# Patient Record
Sex: Female | Born: 2000 | Hispanic: Yes | Marital: Married | Smoking: Never smoker
Health system: Southern US, Community
[De-identification: ages and names within clinical notes are randomized; demographics above are authoritative.]

## PROBLEM LIST (undated history)

## (undated) ENCOUNTER — Inpatient Hospital Stay (HOSPITAL_COMMUNITY): Payer: Self-pay

## (undated) DIAGNOSIS — K831 Obstruction of bile duct: Secondary | ICD-10-CM

## (undated) DIAGNOSIS — T7840XA Allergy, unspecified, initial encounter: Secondary | ICD-10-CM

## (undated) DIAGNOSIS — G43909 Migraine, unspecified, not intractable, without status migrainosus: Secondary | ICD-10-CM

## (undated) DIAGNOSIS — J45909 Unspecified asthma, uncomplicated: Secondary | ICD-10-CM

## (undated) DIAGNOSIS — K8020 Calculus of gallbladder without cholecystitis without obstruction: Secondary | ICD-10-CM

## (undated) HISTORY — DX: Migraine, unspecified, not intractable, without status migrainosus: G43.909

## (undated) HISTORY — DX: Allergy, unspecified, initial encounter: T78.40XA

## (undated) HISTORY — PX: OTHER SURGICAL HISTORY: SHX169

## (undated) HISTORY — DX: Calculus of gallbladder without cholecystitis without obstruction: K80.20

---
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
## 2019-08-03 ENCOUNTER — Encounter (HOSPITAL_COMMUNITY): Payer: Self-pay | Admitting: Emergency Medicine

## 2019-08-03 ENCOUNTER — Other Ambulatory Visit: Payer: Self-pay

## 2019-08-03 ENCOUNTER — Emergency Department (HOSPITAL_COMMUNITY)
Admission: EM | Admit: 2019-08-03 | Discharge: 2019-08-03 | Disposition: A | Discharge status: Home / Self Care | Payer: Medicaid Other | Attending: Emergency Medicine | Admitting: Emergency Medicine

## 2019-08-03 ENCOUNTER — Emergency Department (HOSPITAL_COMMUNITY): Payer: Medicaid Other

## 2019-08-03 DIAGNOSIS — J45901 Unspecified asthma with (acute) exacerbation: Secondary | ICD-10-CM | POA: Diagnosis not present

## 2019-08-03 DIAGNOSIS — R0602 Shortness of breath: Secondary | ICD-10-CM | POA: Diagnosis present

## 2019-08-03 LAB — I-STAT BETA HCG BLOOD, ED (MC, WL, AP ONLY): I-stat hCG, quantitative: <5 m[IU]/mL (ref <5)

## 2019-08-03 MED ORDER — PREDNISONE 20 MG PO TABS
60.0000 mg | ORAL_TABLET | Freq: Once | ORAL | Status: AC
  Administered 2019-08-03: 60 mg via ORAL
  Filled 2019-08-03: qty 3

## 2019-08-03 MED ORDER — ALBUTEROL SULFATE (2.5 MG/3ML) 0.083% IN NEBU
2.5000 mg | INHALATION_SOLUTION | Freq: Three times a day (TID) | RESPIRATORY_TRACT | Status: DC | PRN
  Administered 2019-08-03 (×2): 2.5 mg via RESPIRATORY_TRACT
  Filled 2019-08-03: qty 3

## 2019-08-03 MED ORDER — METHYLPREDNISOLONE 4 MG PO TBPK: ORAL_TABLET | ORAL | 0 refills | Status: DC

## 2019-08-03 MED ORDER — ALBUTEROL SULFATE HFA 108 (90 BASE) MCG/ACT IN AERS: 2.0000 | INHALATION_SPRAY | RESPIRATORY_TRACT | 3 refills | Status: DC | PRN

## 2019-08-03 NOTE — ED Notes (Signed)
Pt discharge home per MD order. Discharge summary reviewed with pt, pt verbalizes understanding. Ambulatory off unit with visitor. No s/s of acute distress noted.

## 2019-08-03 NOTE — ED Provider Notes (Signed)
[facility] EM[facility] DEPART[facility] Provider Note   CSN: [id number] Arrival date & time: 08/03/19  7829     History   Chief Complaint Chief Complaint  Patient presents with  . Asthma  . Shortness of Breath    HPI Charlotte Cohen is a 18 y.o. female with history of asthma, presenting to the emergency department with cough and shortness of breath.  She reports her symptoms began approximately 3 days ago.  She believes the weather change was a trigger for her asthma.  She says she feels her chest is tight and she is wheezing, and says "I think may have bronchitis".  She has been using her albuterol pump every 3 hours at home with a spacer.  She does not have a nebulizer machine.  She states she is not achieving relief.  Her husband at the bedside states she does like to sleep with the air conditioning on, thinks this may be exacerbating her symptoms.  She denies fevers, chills, productive cough, sore throat, headache, myalgia.  She states she been staying at home and self isolating during the virus season.  She lives with her husband.  She has no sick contacts in the house.  She does not smoke or use any recreational drugs.  She just moved to the region a few months ago, and does not have a primary care physician here.  She denies any history of intubation for her asthma or needing to the ICU.  She states she does not remember the last time she was on steroids, believes it has been at least over 1 year.  NKDA No other reported medical problems     HPI  History reviewed. No pertinent past medical history.  There are no active problems to display for this patient.   History reviewed. No pertinent surgical history.   OB History   No obstetric history on file.      Home Medications    Prior to Admission medications   Medication Sig Start Date End Date Taking? Authorizing Provider  albuterol (VENTOLIN HFA) 108 (90 Base) MCG/ACT inhaler Inhale 2 puffs  into the lungs every 4 (four) hours as needed for wheezing or shortness of breath. 08/03/19   Wyvonnia Dusky, MD  methylPREDNISolone (MEDROL DOSEPAK) 4 MG TBPK tablet Use as instructed on package 08/03/19   Wyvonnia Dusky, MD    Family History No family history on file.  Social History Social History   Tobacco Use  . Smoking status: Never Smoker  . Smokeless tobacco: Never Used  Substance Use Topics  . Alcohol use: Never    Frequency: Never  . Drug use: Never     Allergies   Patient has no allergy information on record.   Review of Systems Review of Systems  Constitutional: Negative for chills and fever.  HENT: Negative for congestion and sore throat.   Eyes: Negative for pain and visual disturbance.  Respiratory: Positive for cough, chest tightness and shortness of breath.   Cardiovascular: Negative for chest pain and palpitations.  Gastrointestinal: Negative for abdominal pain, nausea and vomiting.  Musculoskeletal: Negative for arthralgias and myalgias.  Skin: Negative for pallor and rash.  Neurological: Negative for syncope and light-headedness.  Psychiatric/Behavioral: Negative for agitation and confusion.  All other systems reviewed and are negative.    Physical Exam Updated Vital Signs BP 123/82   Pulse 96   Temp 98.6 F (37 C) (Oral)   Resp 18   Wt 98 kg   LMP  07/28/2019   SpO2 97%   Physical Exam Vitals signs and nursing note reviewed.  Constitutional:      General: She is not in acute distress.    Appearance: She is well-developed. She is obese.  HENT:     Head: Normocephalic and atraumatic.  Eyes:     Conjunctiva/sclera: Conjunctivae normal.  Neck:     Musculoskeletal: Neck supple.  Cardiovascular:     Rate and Rhythm: Normal rate and regular rhythm.     Pulses: Normal pulses.     Comments: HR 98 Pulmonary:     Effort: Pulmonary effort is normal. No accessory muscle usage or respiratory distress.     Comments: 95% on room air  Speaking comfortably in full sentences Diffuse bilateral expiratory wheezing in all lung fields Abdominal:     Palpations: Abdomen is soft.     Tenderness: There is no abdominal tenderness.  Skin:    General: Skin is warm and dry.  Neurological:     General: No focal deficit present.     Mental Status: She is alert and oriented to person, place, and time.      ED Treatments / Results  Labs (all labs ordered are listed, but only abnormal results are displayed) Labs Reviewed  I-STAT BETA HCG BLOOD, ED ([facility], [facility], [facility] ONLY)    EKG None  Radiology Dg Chest 2 View  Result Date: 08/03/2019 CLINICAL DATA:  Shortness of breath for 3 days. History of asthma. EXAM: CHEST - 2 VIEW COMPARISON:  None. FINDINGS: The cardiomediastinal contours are normal. Mild central bronchial thickening. Pulmonary vasculature is normal. No consolidation, pleural effusion, or pneumothorax. No acute osseous abnormalities are seen. IMPRESSION: Mild central bronchial thickening, can be seen with asthma or bronchitis. Electronically Signed   By: Narda Rutherford M.D.   On: 08/03/2019 06:59    Procedures Procedures (including critical care time)  Medications Ordered in ED Medications  predniSONE (DELTASONE) tablet 60 mg (60 mg Oral Given 08/03/19 4827)     Initial Impression / Assessment and Plan / ED Course  I have reviewed the triage vital signs and the nursing notes.  Pertinent labs & imaging results that were available during my care of the patient were reviewed by me and considered in my medical decision making (see chart for details).  This is a 18 year old female presenting to emergency department asthma exacerbation ongoing for 3 days.  She has diffuse wheezing on exam.  She is satting 95% on room air, but otherwise demonstrates no evidence of acute respiratory distress or failure.  We will give her 3 rounds of albuterol nebulizers as well as start her on prednisone.  She has no fever or  consolidations in her x-rays to suggest pneumonia.  Do not believe she needs antibiotics at this time.  I have a low suspicion for COVID-19 at this time based on her clinical presentation    Final Clinical Impressions(s) / ED Diagnoses   Final diagnoses:  Exacerbation of asthma, unspecified asthma severity, unspecified whether persistent    ED Discharge Orders         Ordered    methylPREDNISolone (MEDROL DOSEPAK) 4 MG TBPK tablet     08/03/19 0949    albuterol (VENTOLIN HFA) 108 (90 Base) MCG/ACT inhaler  Every 4 hours PRN     08/03/19 0949           Terald Sleeper, MD 08/03/19 [phone]

## 2019-08-03 NOTE — ED Triage Notes (Addendum)
Pt in with sob x 3 days. States hx of asthma and inhaler not helping. States chest sore from coughing, denies fever/chills or any sick contacts. Expiratory wheezes and cough noted

## 2019-08-03 NOTE — Discharge Instructions (Signed)
You should call the number listed in your discharge instructions to establish care with a new primary care doctor.  It is very important that you have a primary care doctor they can follow along and manage your asthma.

## 2019-09-16 HISTORY — PX: WISDOM TOOTH EXTRACTION: SHX21

## 2020-04-09 DIAGNOSIS — E669 Obesity, unspecified: Secondary | ICD-10-CM | POA: Diagnosis not present

## 2020-04-09 DIAGNOSIS — Z0000 Encounter for general adult medical examination without abnormal findings: Secondary | ICD-10-CM | POA: Diagnosis not present

## 2020-04-09 DIAGNOSIS — Z113 Encounter for screening for infections with a predominantly sexual mode of transmission: Secondary | ICD-10-CM | POA: Diagnosis not present

## 2020-04-09 DIAGNOSIS — Z1389 Encounter for screening for other disorder: Secondary | ICD-10-CM | POA: Diagnosis not present

## 2020-04-10 DIAGNOSIS — J45909 Unspecified asthma, uncomplicated: Secondary | ICD-10-CM | POA: Insufficient documentation

## 2020-09-15 HISTORY — PX: BARIATRIC SURGERY: SHX1103

## 2021-02-07 DIAGNOSIS — R7303 Prediabetes: Secondary | ICD-10-CM | POA: Diagnosis not present

## 2021-02-07 DIAGNOSIS — J45909 Unspecified asthma, uncomplicated: Secondary | ICD-10-CM | POA: Diagnosis not present

## 2021-03-23 DIAGNOSIS — J4541 Moderate persistent asthma with (acute) exacerbation: Secondary | ICD-10-CM | POA: Diagnosis not present

## 2021-05-14 ENCOUNTER — Other Ambulatory Visit: Payer: Self-pay

## 2021-05-14 ENCOUNTER — Ambulatory Visit: Payer: Self-pay | Admitting: Family Medicine

## 2021-05-15 ENCOUNTER — Other Ambulatory Visit: Payer: Self-pay

## 2021-05-15 ENCOUNTER — Encounter: Payer: Self-pay | Admitting: Family Medicine

## 2021-05-15 ENCOUNTER — Ambulatory Visit (INDEPENDENT_AMBULATORY_CARE_PROVIDER_SITE_OTHER): Payer: Medicaid Other | Admitting: Family Medicine

## 2021-05-15 VITALS — BP 116/83 | HR 114 | Temp 97.3°F | Resp 20 | Ht 63.0 in | Wt 227.0 lb

## 2021-05-15 DIAGNOSIS — J309 Allergic rhinitis, unspecified: Secondary | ICD-10-CM | POA: Diagnosis not present

## 2021-05-15 DIAGNOSIS — Z0289 Encounter for other administrative examinations: Secondary | ICD-10-CM | POA: Diagnosis not present

## 2021-05-15 DIAGNOSIS — J019 Acute sinusitis, unspecified: Secondary | ICD-10-CM | POA: Diagnosis not present

## 2021-05-15 DIAGNOSIS — Z7689 Persons encountering health services in other specified circumstances: Secondary | ICD-10-CM

## 2021-05-15 DIAGNOSIS — Z6841 Body Mass Index (BMI) 40.0 and over, adult: Secondary | ICD-10-CM

## 2021-05-15 MED ORDER — CETIRIZINE HCL 10 MG PO TABS: 10.0000 mg | ORAL_TABLET | Freq: Every day | ORAL | 1 refills | Status: DC

## 2021-05-15 MED ORDER — FLUTICASONE PROPIONATE 50 MCG/ACT NA SUSP: 2.0000 | Freq: Every day | NASAL | 2 refills | Status: AC

## 2021-05-15 MED ORDER — AZITHROMYCIN 250 MG PO TABS: ORAL_TABLET | ORAL | 0 refills | Status: AC

## 2021-05-15 NOTE — Progress Notes (Signed)
New Patient Office Visit  Subjective:  Patient ID: Charlotte Cohen, female    DOB: 01/05/2001  Age: 20 y.o. MRN: [id number]  CC: No chief complaint on file.   HPI Charlotte Cohen presents for to establish care and for completion of forms for bariatric surgery. Patient also reports recurrent sinus congestion and pain with yellowish drainage. She denies fever/chills or viral sx.   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    Family History  Problem Relation Age of Onset   Cancer Maternal Grandmother     Social History   Socioeconomic History   Marital status: Married    Spouse name: Not on file   Number of children: 0   Years of education: Not on file   Highest education level: Not on file  Occupational History   Not on file  Tobacco Use   Smoking status: Never   Smokeless tobacco: Never  Vaping Use   Vaping Use: Never used  Substance and Sexual Activity   Alcohol use: Never   Drug use: Never   Sexual activity: Yes  Other Topics Concern   Not on file  Social History Narrative   Not on file   Social Determinants of Health   Financial Resource Strain: Not on file  Food Insecurity: Not on file  Transportation Needs: Not on file  Physical Activity: Not on file  Stress: Not on file  Social Connections: Not on file  Intimate Partner Violence: Not on file    ROS Review of Systems  Constitutional:  Negative for fever.  HENT:  Positive for congestion, sinus pressure and sinus pain.   All other systems reviewed and are negative.  Objective:   Today's Vitals: BP 116/83   Pulse (!) 114   Temp (!) 97.3 F (36.3 C)   Resp 20   Ht 5\' 3"  (1.6 m)   Wt 227 lb (103 kg)   LMP  (LMP Unknown) Comment: states LMP was 5 mos ago and irregular  SpO2 92%   BMI 40.21 kg/m   Physical Exam Vitals and nursing note reviewed.  Constitutional:      General: She  is not in acute distress.    Appearance: She is obese.  HENT:     Right Ear: Tympanic membrane normal.     Left Ear: Tympanic membrane normal.     Nose: Congestion present.     Right Sinus: Maxillary sinus tenderness present.     Left Sinus: Maxillary sinus tenderness present.  Cardiovascular:     Rate and Rhythm: Normal rate and regular rhythm.  Pulmonary:     Effort: Pulmonary effort is normal.     Breath sounds: Normal breath sounds.  Abdominal:     Palpations: Abdomen is soft.     Tenderness: There is no abdominal tenderness.  Neurological:     General: No focal deficit present.     Mental Status: She is alert and oriented to person, place, and time.    Assessment & Plan:  1. Class 3 severe obesity due to excess calories without serious comorbidity with body mass index (BMI) of 40.0 to 44.9 in adult [facility]) Management as per consultant. Patient to be scheduled for bariatric surgery.  2. Encounter for completion of form with patient Form completed for bariatric surgery scheduling  3. Allergic rhinitis, unspecified seasonality, unspecified trigger Flonase and  zyrtec prescribed.  4. Acute sinusitis, recurrence not specified, unspecified location As above. Zithromax prescribed. Tylenol/nsaids prn  5. Encounter to establish care   Outpatient Encounter Medications as of 05/15/2021  Medication Sig   [DISCONTINUED] albuterol (VENTOLIN HFA) 108 (90 Base) MCG/ACT inhaler Inhale 2 puffs into the lungs every 4 (four) hours as needed for wheezing or shortness of breath. (Patient not taking: Reported on 05/15/2021)   [DISCONTINUED] methylPREDNISolone (MEDROL DOSEPAK) 4 MG TBPK tablet Use as instructed on package (Patient not taking: Reported on 05/15/2021)   No facility-administered encounter medications on file as of 05/15/2021.    Follow-up: Return if symptoms worsen or fail to improve.   Tommie Raymond, MD

## 2021-05-15 NOTE — Progress Notes (Signed)
New patient/ establish care Discuss concerns about surgery

## 2021-05-17 ENCOUNTER — Ambulatory Visit (INDEPENDENT_AMBULATORY_CARE_PROVIDER_SITE_OTHER): Payer: Medicaid Other

## 2021-05-17 ENCOUNTER — Other Ambulatory Visit: Payer: Self-pay

## 2021-05-17 ENCOUNTER — Ambulatory Visit
Admission: EM | Admit: 2021-05-17 | Discharge: 2021-05-17 | Disposition: A | Discharge status: Home / Self Care | Payer: Medicaid Other | Attending: Urgent Care | Admitting: Urgent Care

## 2021-05-17 DIAGNOSIS — R059 Cough, unspecified: Secondary | ICD-10-CM | POA: Diagnosis not present

## 2021-05-17 DIAGNOSIS — R0602 Shortness of breath: Secondary | ICD-10-CM | POA: Diagnosis not present

## 2021-05-17 DIAGNOSIS — J4540 Moderate persistent asthma, uncomplicated: Secondary | ICD-10-CM | POA: Diagnosis not present

## 2021-05-17 DIAGNOSIS — R062 Wheezing: Secondary | ICD-10-CM

## 2021-05-17 HISTORY — DX: Unspecified asthma, uncomplicated: J45.909

## 2021-05-17 MED ORDER — PREDNISONE 20 MG PO TABS: ORAL_TABLET | ORAL | 0 refills | Status: DC

## 2021-05-17 MED ORDER — METHYLPREDNISOLONE ACETATE 80 MG/ML IJ SUSP
80.0000 mg | Freq: Once | INTRAMUSCULAR | Status: AC
  Administered 2021-05-17: 80 mg via INTRAMUSCULAR

## 2021-05-17 NOTE — ED Triage Notes (Signed)
Pt c/o SOB onset yesterday worst today. States has asthma and uses nebulizer at home. Last treatment was yesterday approx 11pm with temporary relief.   Sneezing started last weekend  Coughing started last Wednesday   Today noticed blood in sputum with worsening SOB.

## 2021-05-17 NOTE — ED Provider Notes (Signed)
[facility]   MRN: [id number] DOB: [birth year]  Subjective:   Charlotte Cohen is a 20 y.o. female presenting for 4 day history of acute onset wheezing, shob, cough, nasal congestion. Noticed green mucus and blood in her cough today. Has a history of asthma, uses nebulized albuterol, did not today.  Does not need refills on her nebulized albuterol.  States that she is also been having a very difficult time with her sinuses.  Takes Flonase and Zyrtec daily.  She did contact her PCP who prescribed her azithromycin.  No testing was done.  Does not want a COVID test today.  No current facility-administered medications for this encounter.  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    Appearance: Normal appearance. She is well-developed. She is not ill-appearing, toxic-appearing or diaphoretic.  HENT:     Head:  Normocephalic and atraumatic.     Nose: Nose normal.     Mouth/Throat:     Mouth: Mucous membranes are moist.     Pharynx: Oropharynx is clear.  Eyes:     General: No scleral icterus.    Extraocular Movements: Extraocular movements intact.     Pupils: Pupils are equal, round, and reactive to light.  Cardiovascular:     Rate and Rhythm: Normal rate and regular rhythm.     Pulses: Normal pulses.     Heart sounds: Normal heart sounds. No murmur heard.   No friction rub. No gallop.  Pulmonary:     Effort: Pulmonary effort is normal. No respiratory distress.     Breath sounds: No stridor. Examination of the right-upper field reveals wheezing. Examination of the left-upper field reveals wheezing. Examination of the right-middle field reveals wheezing. Examination of the left-middle field reveals wheezing. Examination of the right-lower field reveals wheezing. Examination of the left-lower field reveals wheezing. Wheezing present. No decreased breath sounds, rhonchi or rales.  Skin:    General: Skin is warm and dry.     Findings: No rash.  Neurological:     General: No focal deficit present.     Mental Status: She is alert and oriented to person, place,  and time.  Psychiatric:        Mood and Affect: Mood normal.        Behavior: Behavior normal.        Thought Content: Thought content normal.     DG Chest 2 View  Result Date: 05/17/2021 CLINICAL DATA:  Short of breath, cough EXAM: CHEST - 2 VIEW COMPARISON:  08/03/2019 FINDINGS: The heart size and mediastinal contours are within normal limits. Both lungs are clear. The visualized skeletal structures are unremarkable. IMPRESSION: No active cardiopulmonary disease. Electronically Signed   By: Marlan Palau M.D.   On: 05/17/2021 13:42     Assessment and Plan :   PDMP not reviewed this encounter.  1. Moderate persistent asthma without complication   2. Wheezing     Patient refused COVID-19 testing.  Recommended IM Depo-Medrol in  clinic, oral prednisone starting tomorrow.  Emphasized need to maintain nebulized albuterol treatments.  Use supportive care otherwise. Counseled patient on potential for adverse effects with medications prescribed/recommended today, ER and return-to-clinic precautions discussed, patient verbalized understanding.    Wallis Bamberg, [place] 05/17/21 1540

## 2021-12-10 DIAGNOSIS — N926 Irregular menstruation, unspecified: Secondary | ICD-10-CM | POA: Diagnosis not present

## 2021-12-10 DIAGNOSIS — N62 Hypertrophy of breast: Secondary | ICD-10-CM | POA: Diagnosis not present

## 2022-01-16 ENCOUNTER — Telehealth: Payer: Self-pay | Admitting: Family Medicine

## 2022-01-16 ENCOUNTER — Other Ambulatory Visit: Payer: Self-pay

## 2022-01-16 DIAGNOSIS — J309 Allergic rhinitis, unspecified: Secondary | ICD-10-CM

## 2022-01-16 MED ORDER — CETIRIZINE HCL 10 MG PO TABS: 10.0000 mg | ORAL_TABLET | Freq: Every day | ORAL | 1 refills | Status: DC

## 2022-01-16 NOTE — Telephone Encounter (Signed)
Medication Refill - Medication:  ?montelukast ? ?Has the patient contacted their pharmacy? Yes.   ?Contact PCP- ?*I was trying to locate the medication, but did not see it, pt was 100% sure that she takes it for her allergies and it should be there* ? ?Preferred Pharmacy (with phone number or street name):  ?[id number] [place], [place] - [address]  ?[address], [place] [address]  ?Phone:  [phone]  Fax:  [phone]  ? ?Has the patient been seen for an appointment in the last year OR does the patient have an upcoming appointment? Yes.   ? ?Agent: Please be advised that RX refills may take up to 3 business days. We ask that you follow-up with your pharmacy. ?

## 2022-01-16 NOTE — Telephone Encounter (Signed)
Cetirizine refilled for pt. ?

## 2022-01-30 DIAGNOSIS — R102 Pelvic and perineal pain: Secondary | ICD-10-CM | POA: Diagnosis not present

## 2022-01-30 DIAGNOSIS — N926 Irregular menstruation, unspecified: Secondary | ICD-10-CM | POA: Diagnosis not present

## 2022-02-04 ENCOUNTER — Ambulatory Visit
Admission: EM | Admit: 2022-02-04 | Discharge: 2022-02-04 | Disposition: A | Discharge status: Home / Self Care | Payer: Medicaid Other | Attending: Urgent Care | Admitting: Urgent Care

## 2022-02-04 DIAGNOSIS — H9202 Otalgia, left ear: Secondary | ICD-10-CM | POA: Diagnosis not present

## 2022-02-04 DIAGNOSIS — R0982 Postnasal drip: Secondary | ICD-10-CM | POA: Diagnosis not present

## 2022-02-04 DIAGNOSIS — R070 Pain in throat: Secondary | ICD-10-CM | POA: Diagnosis not present

## 2022-02-04 DIAGNOSIS — J309 Allergic rhinitis, unspecified: Secondary | ICD-10-CM | POA: Insufficient documentation

## 2022-02-04 DIAGNOSIS — S39012A Strain of muscle, fascia and tendon of lower back, initial encounter: Secondary | ICD-10-CM | POA: Diagnosis not present

## 2022-02-04 LAB — POCT RAPID STREP A (OFFICE): Rapid Strep A Screen: NEGATIVE

## 2022-02-04 MED ORDER — ESOMEPRAZOLE MAGNESIUM 40 MG PO CPDR: 40.0000 mg | DELAYED_RELEASE_CAPSULE | Freq: Every day | ORAL | 0 refills | Status: DC

## 2022-02-04 MED ORDER — MONTELUKAST SODIUM 10 MG PO TABS: 10.0000 mg | ORAL_TABLET | Freq: Every day | ORAL | 0 refills | Status: DC

## 2022-02-04 MED ORDER — PREDNISONE 10 MG PO TABS: 30.0000 mg | ORAL_TABLET | Freq: Every day | ORAL | 0 refills | Status: DC

## 2022-02-04 MED ORDER — TIZANIDINE HCL 4 MG PO TABS: 4.0000 mg | ORAL_TABLET | Freq: Every day | ORAL | 0 refills | Status: DC

## 2022-02-04 NOTE — ED Provider Notes (Signed)
[facility]   MRN: [id number] DOB: [birth year]  Subjective:   Charlotte Cohen is a 21 y.o. female presenting for 1 week history of persistent and severe low back pain over the right side that radiates toward her right leg.  She has also had throat pain, worse in the morning, left ear pain. Has allergic rhinitis, does not take her medications consistently.  Has a history of bariatric surgery.  No weakness, numbness or tingling, hematuria, dysuria, nausea, vomiting, abdominal pain.  No fall, trauma.  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  Feels like she hurt her back doing her work.  Does not hydrate well with water, drinks a couple of bottles of water per day.  Of note, she just had a bariatric surgery this past December.  No current facility-administered medications for this encounter.  Current Outpatient Medications:    montelukast (SINGULAIR) 10 MG tablet, Take 10 mg by mouth at bedtime., Disp: , Rfl:    cetirizine (ZYRTEC) 10 MG tablet, Take 1 tablet (10 mg total) by mouth daily., Disp: 90 tablet, Rfl: 1   fluticasone (FLONASE) 50 MCG/ACT nasal spray, Place 2 sprays into both nostrils daily., Disp: 16 g, Rfl: 2   predniSONE (DELTASONE) 20 MG tablet, Take 2 tablets daily with breakfast., Disp: 10 tablet, Rfl: 0   No Known Allergies  Past Medical History:  Diagnosis Date   Allergy    Asthma      Past Surgical History:  Procedure Laterality Date   BARIATRIC SURGERY N/A 2022   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    Appearance: Normal appearance. She is well-developed. She is not ill-appearing, toxic-appearing or diaphoretic.  HENT:     Head: Normocephalic and atraumatic.     Right Ear: Tympanic membrane, ear canal and external ear normal. No tenderness. There is no impacted cerumen. Tympanic membrane is not injected, perforated, erythematous or bulging.     Left Ear: Tympanic membrane, ear canal and external ear normal. No tenderness. There is no impacted cerumen. Tympanic membrane is not injected, perforated, erythematous or bulging.     Nose: Nose normal.     Mouth/Throat:     Mouth: Mucous membranes are moist.     Pharynx: No pharyngeal swelling, oropharyngeal exudate, posterior oropharyngeal erythema or uvula swelling.     Tonsils: No tonsillar exudate or tonsillar abscesses. 0 on the right. 0 on the left.  Eyes:     General: No scleral icterus.       Right eye: No discharge.        Left eye: No discharge.     Extraocular Movements: Extraocular movements intact.  Cardiovascular:     Rate and Rhythm: Normal rate.  Pulmonary:  Effort: Pulmonary effort is normal.  Musculoskeletal:     Lumbar back: Tenderness present. No swelling, edema, deformity, signs of trauma, lacerations, spasms or bony tenderness. Normal range of motion. Negative right straight leg raise test and negative left straight leg raise test. No scoliosis.       Back:  Skin:    General: Skin is warm and dry.  Neurological:     General: No focal deficit present.     Mental Status: She is alert and oriented to person, place, and time.     Motor: No weakness.     Coordination: Coordination normal.     Gait: Gait normal.     Deep Tendon Reflexes: Reflexes normal.  Psychiatric:        Mood and Affect: Mood normal.        Behavior: Behavior normal.        Thought Content: Thought content normal.        Judgment:  Judgment normal.    Results for orders placed or performed during the hospital encounter of 02/04/22 (from the past 24 hour(s))  POCT rapid strep A     Status: None   Collection Time: 02/04/22  6:52 PM  Result Value Ref Range   Rapid Strep A Screen Negative Negative    Assessment and Plan :   PDMP not reviewed this encounter.  1. Throat pain   2. Allergic rhinitis, unspecified seasonality, unspecified trigger   3. Left ear pain   4. Lumbar strain, initial encounter   5. Post-nasal drainage    Throat culture pending, recommended starting her allergy medications again.  Emphasized need to maintain them.  Regarding her back, her symptoms are severe and likely related to lumbar strain.  We both agreed to avoid the use of narcotic pain medicines.  Unfortunately with her bariatric surgery this poses risk for use of NSAIDs and steroids.  We discussed those in detail and we will do an oral course of prednisone.  We will couple this for the increased risk of gastric ulcers and gastric bleeding by using Nexium.  Emphasized need for good back care.  Maintain daily hydration.  Counseled patient on potential for adverse effects with medications prescribed/recommended today, ER and return-to-clinic precautions discussed, patient verbalized understanding.    Wallis Bamberg, [place] 02/04/22 1930

## 2022-02-04 NOTE — ED Triage Notes (Addendum)
Pt c/o back pain that radiates down her right leg.   Pt c/o sore throat and redness to her tonsils.  Pt requesting singular refill    Started: 1 week

## 2022-02-08 LAB — CULTURE, GROUP A STREP (THRC)

## 2022-02-13 ENCOUNTER — Ambulatory Visit
Admission: EM | Admit: 2022-02-13 | Discharge: 2022-02-13 | Disposition: A | Discharge status: Home / Self Care | Payer: Medicaid Other | Attending: Family Medicine | Admitting: Family Medicine

## 2022-02-13 DIAGNOSIS — G43011 Migraine without aura, intractable, with status migrainosus: Secondary | ICD-10-CM

## 2022-02-13 MED ORDER — DEXAMETHASONE SODIUM PHOSPHATE 10 MG/ML IJ SOLN
10.0000 mg | Freq: Once | INTRAMUSCULAR | Status: AC
  Administered 2022-02-13: 10 mg via INTRAMUSCULAR

## 2022-02-13 MED ORDER — KETOROLAC TROMETHAMINE 30 MG/ML IJ SOLN
30.0000 mg | Freq: Once | INTRAMUSCULAR | Status: AC
  Administered 2022-02-13: 30 mg via INTRAMUSCULAR

## 2022-02-13 MED ORDER — SUMATRIPTAN SUCCINATE 6 MG/0.5ML ~~LOC~~ SOLN
6.0000 mg | Freq: Once | SUBCUTANEOUS | Status: AC
  Administered 2022-02-13: 6 mg via SUBCUTANEOUS

## 2022-02-13 MED ORDER — METOCLOPRAMIDE HCL 5 MG/ML IJ SOLN
5.0000 mg | Freq: Once | INTRAMUSCULAR | Status: AC
  Administered 2022-02-13: 5 mg via INTRAMUSCULAR

## 2022-02-13 NOTE — ED Provider Notes (Signed)
[facility]    CSN: [id number] Arrival date & time: 02/13/22  0813      History   Chief Complaint Chief Complaint  Patient presents with   Headache    HPI Charlotte Cohen is a 21 y.o. female.    Headache Here for headache since May 29.  At first it was on the right side of her head, and then it is moved onto the other side of her head.  It is described as throbbing and severe.  She also has nausea with it and photophobia.  No scotomata.  Apparently she does have a history of headaches, but they usually do not last this long.  Last menstrual cycle was May 4.    Past Medical History:  Diagnosis Date   Allergy    Asthma     There are no problems to display for this patient.   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      Home Medications    Prior to Admission medications   Medication Sig Start Date End Date Taking? Authorizing Provider  cetirizine (ZYRTEC) 10 MG tablet Take 1 tablet (10 mg total) by mouth daily. 01/16/22   Georganna Skeans, MD  esomeprazole (NEXIUM) 40 MG capsule Take 1 capsule (40 mg total) by mouth daily. 02/04/22   Wallis Bamberg, PA-C  fluticasone (FLONASE) 50 MCG/ACT nasal spray Place 2 sprays into both nostrils daily. 05/15/21   Georganna Skeans, MD  montelukast (SINGULAIR) 10 MG tablet Take 1 tablet (10 mg total) by mouth at bedtime. 02/04/22   Wallis Bamberg, PA-C  predniSONE (DELTASONE) 10 MG tablet Take 3 tablets (30 mg total) by mouth daily with breakfast. Patient not taking: Reported on 02/13/2022 02/04/22   Wallis Bamberg, PA-C  tiZANidine (ZANAFLEX) 4 MG tablet Take 1 tablet (4 mg total) by mouth at bedtime. Patient not taking: Reported on 02/13/2022 02/04/22   Wallis Bamberg, PA-C    Family History Family History  Problem Relation Age of Onset   Cancer Maternal Grandmother     Social History Social History    Tobacco Use   Smoking status: Never   Smokeless tobacco: Never  Vaping Use   Vaping Use: Never used  Substance Use Topics   Alcohol use: Never   Drug use: Never     Allergies   Patient has no known allergies.   Review of Systems Review of Systems  Neurological:  Positive for headaches.    Physical Exam Triage Vital Signs ED Triage Vitals  Enc Vitals Group     BP 02/13/22 0824 127/87     Pulse Rate 02/13/22 0824 80     Resp 02/13/22 0824 18     Temp 02/13/22 0824 97.8 F (36.6 C)     Temp Source 02/13/22 0824 Oral     SpO2 02/13/22 0824 95 %     Weight --      Height --      Head Circumference --      Peak Flow --      Pain Score 02/13/22 0820 10     Pain Loc --      Pain Edu? --      Excl. in GC? --    No data found.  Updated Vital Signs BP 127/87   Pulse 80   Temp 97.8 F (  36.6 C) (Oral)   Resp 18   SpO2 95%   Visual Acuity Right Eye Distance:   Left Eye Distance:   Bilateral Distance:    Right Eye Near:   Left Eye Near:    Bilateral Near:     Physical Exam Vitals reviewed.  Constitutional:      General: She is not in acute distress.    Appearance: She is not toxic-appearing.  HENT:     Right Ear: Tympanic membrane and ear canal normal.     Left Ear: Tympanic membrane and ear canal normal.     Nose: Nose normal.     Mouth/Throat:     Mouth: Mucous membranes are moist.     Pharynx: No oropharyngeal exudate or posterior oropharyngeal erythema.  Eyes:     Extraocular Movements: Extraocular movements intact.     Conjunctiva/sclera: Conjunctivae normal.     Pupils: Pupils are equal, round, and reactive to light.  Cardiovascular:     Rate and Rhythm: Normal rate and regular rhythm.     Heart sounds: No murmur heard. Pulmonary:     Effort: Pulmonary effort is normal. No respiratory distress.     Breath sounds: No wheezing, rhonchi or rales.  Musculoskeletal:     Cervical back: Neck supple.  Lymphadenopathy:     Cervical: No cervical  adenopathy.  Skin:    Capillary Refill: Capillary refill takes less than 2 seconds.     Coloration: Skin is not jaundiced or pale.  Neurological:     General: No focal deficit present.     Mental Status: She is alert and oriented to person, place, and time.     Cranial Nerves: No cranial nerve deficit.     Sensory: No sensory deficit.     Motor: No weakness.     Coordination: Coordination normal.     Gait: Gait normal.     Deep Tendon Reflexes: Reflexes normal.  Psychiatric:        Behavior: Behavior normal.     UC Treatments / Results  Labs (all labs ordered are listed, but only abnormal results are displayed) Labs Reviewed - No data to display  EKG   Radiology No results found.  Procedures Procedures (including critical care time)  Medications Ordered in UC Medications  ketorolac (TORADOL) 30 MG/ML injection 30 mg (has no administration in time range)  metoCLOPramide (REGLAN) injection 5 mg (has no administration in time range)  dexamethasone (DECADRON) injection 10 mg (has no administration in time range)  SUMAtriptan (IMITREX) injection 6 mg (has no administration in time range)    Initial Impression / Assessment and Plan / UC Course  I have reviewed the triage vital signs and the nursing notes.  Pertinent labs & imaging results that were available during my care of the patient were reviewed by me and considered in my medical decision making (see chart for details).     Will treat for intractable migraine. She will f/u with PCP for possible rx for her migraines. Final Clinical Impressions(s) / UC Diagnoses   Final diagnoses:  Intractable migraine without aura and with status migrainosus     Discharge Instructions      You have been given an injection of dexamethasone 10 mg, Toradol 30 mg, Reglan 5 mg,And sumatriptan 6 mg.   These follow-up with your primary care in the next 2 to 3 weeks about possible migraine medication     ED Prescriptions    None    PDMP not reviewed  this encounter.   Zenia ResidesBanister, Shanicka Oldenkamp K, MD 02/13/22 [phone]

## 2022-02-13 NOTE — ED Triage Notes (Addendum)
Patient presents to Urgent Care with complaints of ha since Monday. Patient reports pain is under the eyes st it feels heavy and photophobia, nausea and dizziness. Pt reports tylenol and Excedrin with minimal help. Last dose last night

## 2022-02-13 NOTE — Discharge Instructions (Addendum)
You have been given an injection of dexamethasone 10 mg, Toradol 30 mg, Reglan 5 mg,And sumatriptan 6 mg.   These follow-up with your primary care in the next 2 to 3 weeks about possible migraine medication

## 2022-02-16 ENCOUNTER — Encounter (HOSPITAL_COMMUNITY): Payer: Self-pay | Admitting: Emergency Medicine

## 2022-02-16 ENCOUNTER — Other Ambulatory Visit: Payer: Self-pay

## 2022-02-16 ENCOUNTER — Emergency Department (HOSPITAL_COMMUNITY): Payer: Medicaid Other

## 2022-02-16 ENCOUNTER — Emergency Department (HOSPITAL_COMMUNITY)
Admission: EM | Admit: 2022-02-16 | Discharge: 2022-02-16 | Disposition: A | Discharge status: Home / Self Care | Payer: Medicaid Other | Attending: Emergency Medicine | Admitting: Emergency Medicine

## 2022-02-16 DIAGNOSIS — R110 Nausea: Secondary | ICD-10-CM | POA: Insufficient documentation

## 2022-02-16 DIAGNOSIS — R519 Headache, unspecified: Secondary | ICD-10-CM | POA: Diagnosis not present

## 2022-02-16 DIAGNOSIS — L905 Scar conditions and fibrosis of skin: Secondary | ICD-10-CM | POA: Diagnosis not present

## 2022-02-16 DIAGNOSIS — H53149 Visual discomfort, unspecified: Secondary | ICD-10-CM | POA: Diagnosis not present

## 2022-02-16 LAB — CBC WITH DIFFERENTIAL/PLATELET
Abs Immature Granulocytes: 0.03 10*3/uL (ref 0.00–0.07)
Basophils Absolute: 0.1 10*3/uL (ref 0.0–0.1)
Basophils Relative: 1 %
Eosinophils Absolute: 0.1 10*3/uL (ref 0.0–0.5)
Eosinophils Relative: 2 %
HCT: 42.3 % (ref 36.0–46.0)
Hemoglobin: 14 g/dL (ref 12.0–15.0)
Immature Granulocytes: 0 %
Lymphocytes Relative: 39 %
Lymphs Abs: 3.4 10*3/uL (ref 0.7–4.0)
MCH: 31.4 pg (ref 26.0–34.0)
MCHC: 33.1 g/dL (ref 30.0–36.0)
MCV: 94.8 fL (ref 80.0–100.0)
Monocytes Absolute: 0.7 10*3/uL (ref 0.1–1.0)
Monocytes Relative: 8 %
Neutro Abs: 4.3 10*3/uL (ref 1.7–7.7)
Neutrophils Relative %: 50 %
Platelets: 301 10*3/uL (ref 150–400)
RBC: 4.46 MIL/uL (ref 3.87–5.11)
RDW: 13.2 % (ref 11.5–15.5)
WBC: 8.6 10*3/uL (ref 4.0–10.5)
nRBC: 0 % (ref 0.0–0.2)

## 2022-02-16 LAB — COMPREHENSIVE METABOLIC PANEL
ALT: 16 U/L (ref 0–44)
AST: 16 U/L (ref 15–41)
Albumin: 4 g/dL (ref 3.5–5.0)
Alkaline Phosphatase: 48 U/L (ref 38–126)
Anion gap: 7 (ref 5–15)
BUN: 14 mg/dL (ref 6–20)
CO2: 27 mmol/L (ref 22–32)
Calcium: 9.4 mg/dL (ref 8.9–10.3)
Chloride: 104 mmol/L (ref 98–111)
Creatinine, Ser: 0.88 mg/dL (ref 0.44–1.00)
GFR, Estimated: >60 mL/min (ref >60)
Glucose, Bld: 113 mg/dL — ABNORMAL HIGH (ref 70–99)
Potassium: 3.8 mmol/L (ref 3.5–5.1)
Sodium: 138 mmol/L (ref 135–145)
Total Bilirubin: 0.6 mg/dL (ref 0.3–1.2)
Total Protein: 6.7 g/dL (ref 6.5–8.1)

## 2022-02-16 LAB — I-STAT BETA HCG BLOOD, ED (MC, WL, AP ONLY): I-stat hCG, quantitative: <5 m[IU]/mL (ref <5)

## 2022-02-16 MED ORDER — SUMATRIPTAN SUCCINATE 6 MG/0.5ML ~~LOC~~ SOLN
6.0000 mg | Freq: Once | SUBCUTANEOUS | Status: AC
  Administered 2022-02-16: 6 mg via SUBCUTANEOUS
  Filled 2022-02-16: qty 0.5

## 2022-02-16 MED ORDER — SUMATRIPTAN SUCCINATE 25 MG PO TABS: 50.0000 mg | ORAL_TABLET | ORAL | 0 refills | Status: DC | PRN

## 2022-02-16 MED ORDER — NAPROXEN 500 MG PO TABS: 500.0000 mg | ORAL_TABLET | Freq: Two times a day (BID) | ORAL | 0 refills | Status: DC

## 2022-02-16 MED ORDER — HYDROCODONE-ACETAMINOPHEN 5-325 MG PO TABS
2.0000 | ORAL_TABLET | Freq: Once | ORAL | Status: AC
  Administered 2022-02-16: 2 via ORAL
  Filled 2022-02-16: qty 2

## 2022-02-16 NOTE — Discharge Instructions (Signed)
Please take Naprosyn, 500mg  by mouth twice daily as needed for pain - this in an antiinflammatory medicine (NSAID) and is similar to ibuprofen - many people feel that it is stronger than ibuprofen and it is easier to take since it is a smaller pill.  Please use this only for 1 week - if your pain persists, you will need to follow up with your doctor in the office for ongoing guidance and pain control.  Please stop taking Excedrin Migraine  Please follow-up with a neurologist, but I have asked for them to call you to make an appointment  You should also make an appointment with a family doctor, you may need further testing if your headaches continue  Emergency department for severe worsening symptoms

## 2022-02-16 NOTE — ED Provider Notes (Signed)
[facility] EMERGENCY DEPARTMENT Provider Note   CSN: [id number] Arrival date & time: 02/16/22  1411     History  Chief Complaint  Patient presents with   Headache    Charlotte Cohen is a 21 y.o. female.   Headache  This patient is a 21 year old female, she has a history of seasonal allergies for which she takes cetirizine and possibly montelukast.  She takes no other daily medications according to her report, she had recently been seen at an urgent care where she was told that she had a migraine and was given some type of injectable medication and told to continue to take over-the-counter medications however over the last 6 days she has had no relief.  The headache encircles her eyes, she feels it in her bilateral temples, throbbing and constant.  She reports a history of having intermittent headaches about twice a month for the last 6 years.  This 1 seems to be lasting longer and seems to be more intense.  She denies fevers chills or stiff neck, she has no coughing shortness of breath or chest pain or belly pain, no rashes or tick bites, no fevers.  She has some nausea and photophobia with this today.  She does not have a formal diagnosis of migraines and has never been seen by a family doctor or a neurologist for these complaints in the past.  He has been taking Excedrin Migraine 2 tablets every 6 hours but not relieving her pain  Home Medications Prior to Admission medications   Medication Sig Start Date End Date Taking? Authorizing Provider  naproxen (NAPROSYN) 500 MG tablet Take 1 tablet (500 mg total) by mouth 2 (two) times daily with a meal. 02/16/22  Yes Noemi Chapel, MD  SUMAtriptan (IMITREX) 25 MG tablet Take 2 tablets (50 mg total) by mouth every 2 (two) hours as needed for migraine (ongoing headache). Maximum daily dose 200mg  02/16/22  Yes Noemi Chapel, MD  cetirizine (ZYRTEC) 10 MG tablet Take 1 tablet (10 mg total) by mouth daily. 01/16/22   Dorna Mai, MD   esomeprazole (NEXIUM) 40 MG capsule Take 1 capsule (40 mg total) by mouth daily. 02/04/22   Jaynee Eagles, PA-C  fluticasone (FLONASE) 50 MCG/ACT nasal spray Place 2 sprays into both nostrils daily. 05/15/21   Dorna Mai, MD  montelukast (SINGULAIR) 10 MG tablet Take 1 tablet (10 mg total) by mouth at bedtime. 02/04/22   Jaynee Eagles, PA-C  predniSONE (DELTASONE) 10 MG tablet Take 3 tablets (30 mg total) by mouth daily with breakfast. Patient not taking: Reported on 02/13/2022 02/04/22   Jaynee Eagles, PA-C  tiZANidine (ZANAFLEX) 4 MG tablet Take 1 tablet (4 mg total) by mouth at bedtime. Patient not taking: Reported on 02/13/2022 02/04/22   Jaynee Eagles, PA-C      Allergies    Patient has no known allergies.    Review of Systems   Review of Systems  Neurological:  Positive for headaches.  All other systems reviewed and are negative.  Physical Exam Updated Vital Signs BP 119/69 (BP Location: Right Arm)   Pulse 63   Temp 98.7 F (37.1 C) (Oral)   Resp 18   LMP 01/31/2022   SpO2 99%  Physical Exam Vitals and nursing note reviewed.  Constitutional:      General: She is not in acute distress.    Appearance: She is well-developed.  HENT:     Head: Normocephalic and atraumatic.     Comments: Small scar just anterior  to the right ear from prior surgery, facial exam otherwise unremarkable    Mouth/Throat:     Pharynx: No oropharyngeal exudate.  Eyes:     General: No scleral icterus.       Right eye: No discharge.        Left eye: No discharge.     Conjunctiva/sclera: Conjunctivae normal.     Pupils: Pupils are equal, round, and reactive to light.  Neck:     Thyroid: No thyromegaly.     Vascular: No JVD.  Cardiovascular:     Rate and Rhythm: Normal rate and regular rhythm.     Heart sounds: Normal heart sounds. No murmur heard.   No friction rub. No gallop.  Pulmonary:     Effort: Pulmonary effort is normal. No respiratory distress.     Breath sounds: Normal breath sounds. No  wheezing or rales.  Abdominal:     General: Bowel sounds are normal. There is no distension.     Palpations: Abdomen is soft. There is no mass.     Tenderness: There is no abdominal tenderness.  Musculoskeletal:        General: No tenderness. Normal range of motion.     Cervical back: Normal range of motion and neck supple.  Lymphadenopathy:     Cervical: No cervical adenopathy.  Skin:    General: Skin is warm and dry.     Findings: No erythema or rash.  Neurological:     General: No focal deficit present.     Mental Status: She is alert.     Coordination: Coordination normal.     Comments: Speech is clear, cranial nerves III through XII are intact, memory is intact, strength is normal in all 4 extremities including grips, sensation is intact to light touch and pinprick in all 4 extremities. Coordination as tested by finger-nose-finger is normal, no limb ataxia. Normal gait, normal reflexes at the patellar tendons bilaterally  Psychiatric:        Behavior: Behavior normal.    ED Results / Procedures / Treatments   Labs (all labs ordered are listed, but only abnormal results are displayed) Labs Reviewed  COMPREHENSIVE METABOLIC PANEL - Abnormal; Notable for the following components:      Result Value   Glucose, Bld 113 (*)    All other components within normal limits  CBC WITH DIFFERENTIAL/PLATELET  I-STAT BETA HCG BLOOD, ED ([facility], [facility], [facility] ONLY)    EKG None  Radiology CT HEAD WO CONTRAST (5MM)  Result Date: 02/16/2022 CLINICAL DATA:  Chronic headaches, increased frequency EXAM: CT HEAD WITHOUT CONTRAST TECHNIQUE: Contiguous axial images were obtained from the base of the skull through the vertex without intravenous contrast. RADIATION DOSE REDUCTION: This exam was performed according to the departmental dose-optimization program which includes automated exposure control, adjustment of the mA and/or kV according to patient size and/or use of iterative reconstruction technique.  COMPARISON:  None Available. FINDINGS: Brain: No evidence of acute infarction, hemorrhage, hydrocephalus, extra-axial collection or mass lesion/mass effect. Vascular: No hyperdense vessel or unexpected calcification. Skull: Normal. Negative for fracture or focal lesion. Sinuses/Orbits: No acute finding. Other: None. IMPRESSION: Normal head CT without contrast Electronically Signed   By: Jerilynn Mages.  Shick M.D.   On: 02/16/2022 15:37    Procedures Procedures    Medications Ordered in ED Medications  SUMAtriptan (IMITREX) injection 6 mg (has no administration in time range)  HYDROcodone-acetaminophen (NORCO/VICODIN) 5-325 MG per tablet 2 tablet (has no administration in time range)  ED Course/ Medical Decision Making/ A&P                           Medical Decision Making  This patient presents to the ED for concern of headache, this involves an extensive number of treatment options, and is a complaint that carries with it a high risk of complications and morbidity.  The differential diagnosis includes migraine, tension, less likely to be cluster, would consider idiopathic intracranial hypertension however she has no visual changes and the headache seems to be more sporadic and intermittent.  Does not appear to be intracranial hemorrhage or tumor   Co morbidities that complicate the patient evaluation  None   Additional history obtained:  Additional history obtained from family member External records from outside source obtained and reviewed including there is no old records to review   Lab Tests:  I Ordered, and personally interpreted labs.  The pertinent results include: Labs unremarkable   Imaging Studies ordered:  I ordered imaging studies including CT scan of the brain without contrast that was added on arrival I independently visualized and interpreted imaging which showed no acute findings I agree with the radiologist interpretation   Cardiac Monitoring: / EKG:  The patient  was maintained on a cardiac monitor.  I personally viewed and interpreted the cardiac monitored which showed an underlying rhythm of: Normal rhythm   Consultations Obtained:  Referred to neurology as an outpatient  Problem List / ED Course / Critical interventions / Medication management  Headache, given Imitrex, will start on Naprosyn and Imitrex at home, requested that she stop taking aspirin containing products I ordered medication including Imitrex for headache Reevaluation of the patient after these medicines showed that the patient improved I have reviewed the patients home medicines and have made adjustments as needed   Social Determinants of Health:  None   Test / Admission - Considered:  Consider neurology consultation but patient has no focal neurologic deficits, she does not need a lumbar puncture, she does not need an MRI, she can follow-up outpatient.  Patient expressed understanding         Final Clinical Impression(s) / ED Diagnoses Final diagnoses:  Acute nonintractable headache, unspecified headache type    Rx / DC Orders ED Discharge Orders          Ordered    SUMAtriptan (IMITREX) 25 MG tablet  Every 2 hours PRN        02/16/22 1646    naproxen (NAPROSYN) 500 MG tablet  2 times daily with meals        02/16/22 1646    Ambulatory referral to Neurology       Comments: An appointment is requested in approximately: 1 week   02/16/22 1648              Noemi Chapel, MD 02/16/22 1649

## 2022-02-16 NOTE — ED Provider Triage Note (Signed)
Emergency Medicine Provider Triage Evaluation Note  Charlotte Cohen , a 21 y.o. female  was evaluated in triage.  Pt complains of headache.  She reports pain around her eyes bilaterally.   No nasal congestion.   Hasn't had vision checked before.   No blurry vision, no fevers.   Hasn't hit head.   She went to urgent care for this headache but it didn't get better.  She does have migraines but they have never lasted this long and this is more severe.   Physical Exam  BP 119/69 (BP Location: Right Arm)   Pulse 63   Temp 98.7 F (37.1 C) (Oral)   Resp 18   LMP 01/31/2022   SpO2 99%  Gen:   Awake, no distress   Resp:  Normal effort  MSK:   Moves extremities without difficulty  Other:  Normal speech.   Medical Decision Making  Medically screening exam initiated at 3:01 PM.  Appropriate orders placed.  Andra Heslin was informed that the remainder of the evaluation will be completed by another provider, this initial triage assessment does not replace that evaluation, and the importance of remaining in the ED until their evaluation is complete.     Cristina Gong, [place] 02/16/22 1504

## 2022-02-16 NOTE — ED Triage Notes (Signed)
Pt reports constant pressure to bilateral eyes and generalized headache x 1 week.  States she was seen at [facility] on 6/1 and received medication IM without improvement.  Taking Tylenol and Excedrin at home.

## 2022-05-07 ENCOUNTER — Encounter (HOSPITAL_COMMUNITY): Payer: Self-pay | Admitting: Emergency Medicine

## 2022-05-07 ENCOUNTER — Emergency Department (HOSPITAL_COMMUNITY): Payer: Self-pay

## 2022-05-07 ENCOUNTER — Emergency Department (HOSPITAL_COMMUNITY)
Admission: EM | Admit: 2022-05-07 | Discharge: 2022-05-07 | Disposition: A | Discharge status: Home / Self Care | Payer: Self-pay | Attending: Emergency Medicine | Admitting: Emergency Medicine

## 2022-05-07 DIAGNOSIS — H538 Other visual disturbances: Secondary | ICD-10-CM

## 2022-05-07 DIAGNOSIS — R519 Headache, unspecified: Secondary | ICD-10-CM | POA: Diagnosis not present

## 2022-05-07 DIAGNOSIS — H532 Diplopia: Secondary | ICD-10-CM | POA: Insufficient documentation

## 2022-05-07 LAB — CBC WITH DIFFERENTIAL/PLATELET
Abs Immature Granulocytes: 0.02 10*3/uL (ref 0.00–0.07)
Basophils Absolute: 0.1 10*3/uL (ref 0.0–0.1)
Basophils Relative: 1 %
Eosinophils Absolute: 0.3 10*3/uL (ref 0.0–0.5)
Eosinophils Relative: 4 %
HCT: 41.4 % (ref 36.0–46.0)
Hemoglobin: 13.9 g/dL (ref 12.0–15.0)
Immature Granulocytes: 0 %
Lymphocytes Relative: 37 %
Lymphs Abs: 2.5 10*3/uL (ref 0.7–4.0)
MCH: 31.1 pg (ref 26.0–34.0)
MCHC: 33.6 g/dL (ref 30.0–36.0)
MCV: 92.6 fL (ref 80.0–100.0)
Monocytes Absolute: 0.5 10*3/uL (ref 0.1–1.0)
Monocytes Relative: 8 %
Neutro Abs: 3.2 10*3/uL (ref 1.7–7.7)
Neutrophils Relative %: 50 %
Platelets: 301 10*3/uL (ref 150–400)
RBC: 4.47 MIL/uL (ref 3.87–5.11)
RDW: 12.6 % (ref 11.5–15.5)
WBC: 6.6 10*3/uL (ref 4.0–10.5)
nRBC: 0 % (ref 0.0–0.2)

## 2022-05-07 LAB — BASIC METABOLIC PANEL
Anion gap: 6 (ref 5–15)
BUN: 6 mg/dL (ref 6–20)
CO2: 26 mmol/L (ref 22–32)
Calcium: 9.9 mg/dL (ref 8.9–10.3)
Chloride: 107 mmol/L (ref 98–111)
Creatinine, Ser: 0.71 mg/dL (ref 0.44–1.00)
GFR, Estimated: >60 mL/min (ref >60)
Glucose, Bld: 98 mg/dL (ref 70–99)
Potassium: 3.9 mmol/L (ref 3.5–5.1)
Sodium: 139 mmol/L (ref 135–145)

## 2022-05-07 LAB — I-STAT BETA HCG BLOOD, ED (MC, WL, AP ONLY): I-stat hCG, quantitative: <5 m[IU]/mL (ref <5)

## 2022-05-07 MED ORDER — GADOBUTROL 1 MMOL/ML IV SOLN
8.0000 mL | Freq: Once | INTRAVENOUS | Status: AC | PRN
Start: 2022-05-07 — End: 2022-05-07
  Administered 2022-05-07: 8 mL via INTRAVENOUS

## 2022-05-07 MED ORDER — ACETAMINOPHEN 500 MG PO TABS
1000.0000 mg | ORAL_TABLET | Freq: Once | ORAL | Status: AC
  Administered 2022-05-07: 1000 mg via ORAL
  Filled 2022-05-07: qty 2

## 2022-05-07 MED ORDER — DIPHENHYDRAMINE HCL 50 MG/ML IJ SOLN
25.0000 mg | Freq: Once | INTRAMUSCULAR | Status: AC
  Administered 2022-05-07: 25 mg via INTRAVENOUS
  Filled 2022-05-07: qty 1

## 2022-05-07 MED ORDER — KETOROLAC TROMETHAMINE 30 MG/ML IJ SOLN
15.0000 mg | Freq: Once | INTRAMUSCULAR | Status: AC
  Administered 2022-05-07: 15 mg via INTRAVENOUS
  Filled 2022-05-07: qty 1

## 2022-05-07 NOTE — ED Provider Notes (Signed)
[facility] EMERGENCY DEPARTMENT Provider Note   CSN: [id number] Arrival date & time: 05/07/22  1238     History  Chief Complaint  Patient presents with   Blurred Vision    Fatim Vanderschaaf is a 21 y.o. female.  21 year old female with prior medical history as detailed below presents for evaluation.  Patient complains of bilateral low blurry vision.  Patient reports that the symptoms were first noticed this morning when she woke up.  She does complain of mild frontal headache associated with same.  She does wear glasses at baseline.  She is not wearing them currently.  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  She reports that she wears them rarely.  Her last eyeglass prescription checkup was about 1 year prior.  She also reports mild "double vision" with extreme lateral gaze to the left.  She denies nausea or vomiting.  She has a history of migraine, although she reports that migraines in the past have not caused significant visual aura or symptoms.  She denies unsteady gait.  She denies numbness or tingling.  She denies difficulty with speech.    The history is provided by the patient and medical records.       Home Medications Prior to Admission medications   Medication Sig Start Date End Date Taking? Authorizing Provider  cetirizine (ZYRTEC) 10 MG tablet Take 1 tablet (10 mg total) by mouth daily. 01/16/22   Georganna Skeans, MD  esomeprazole (NEXIUM) 40 MG capsule Take 1 capsule (40 mg total) by mouth daily. 02/04/22   Wallis Bamberg, PA-C  fluticasone (FLONASE) 50 MCG/ACT nasal spray Place 2 sprays into both nostrils daily. 05/15/21   Georganna Skeans, MD  montelukast (SINGULAIR) 10 MG tablet Take 1 tablet (10 mg total) by mouth at bedtime. 02/04/22   Wallis Bamberg, PA-C  naproxen (NAPROSYN) 500 MG tablet Take 1 tablet (500 mg total) by mouth 2 (two) times daily with a meal. 02/16/22   Eber Hong, MD  predniSONE (DELTASONE) 10 MG tablet Take 3 tablets (30  mg total) by mouth daily with breakfast. Patient not taking: Reported on 02/13/2022 02/04/22   Wallis Bamberg, PA-C  SUMAtriptan (IMITREX) 25 MG tablet Take 2 tablets (50 mg total) by mouth every 2 (two) hours as needed for migraine (ongoing headache). Maximum daily dose 200mg  02/16/22   04/18/22, MD  tiZANidine (ZANAFLEX) 4 MG tablet Take 1 tablet (4 mg total) by mouth at bedtime. Patient not taking: Reported on 02/13/2022 02/04/22   02/06/22, PA-C      Allergies    Patient has no known allergies.    Review of Systems   Review of Systems  All other systems reviewed and are negative.   Physical Exam Updated Vital Signs BP 111/67   Pulse 72   Temp 98.1 F (36.7 C) (Oral)   Resp 16   SpO2 97%  Physical Exam Vitals and nursing note reviewed.  Constitutional:      General: She is not in acute distress.    Appearance: Normal appearance. She is well-developed.  HENT:     Head: Normocephalic and atraumatic.  Eyes:     General: Lids are normal. Vision grossly intact. Gaze aligned appropriately. No visual field deficit or scleral icterus.       Right eye: No discharge.        Left eye: No discharge.     Extraocular Movements: Extraocular movements intact.     Right eye: Normal extraocular motion and no nystagmus.  Left eye: Normal extraocular motion and no nystagmus.     Conjunctiva/sclera: Conjunctivae normal.     Right eye: Right conjunctiva is not injected. No exudate or hemorrhage.    Left eye: Left conjunctiva is not injected. No exudate or hemorrhage.    Pupils: Pupils are equal, round, and reactive to light.     Comments: No proptosis   Cardiovascular:     Rate and Rhythm: Normal rate and regular rhythm.     Heart sounds: Normal heart sounds.  Pulmonary:     Effort: Pulmonary effort is normal. No respiratory distress.     Breath sounds: Normal breath sounds.  Abdominal:     General: There is no distension.     Palpations: Abdomen is soft.     Tenderness: There is no  abdominal tenderness.  Musculoskeletal:        General: No deformity. Normal range of motion.     Cervical back: Normal range of motion and neck supple.  Skin:    General: Skin is warm and dry.  Neurological:     General: No focal deficit present.     Mental Status: She is alert and oriented to person, place, and time.     ED Results / Procedures / Treatments   Labs (all labs ordered are listed, but only abnormal results are displayed) Labs Reviewed  CBC WITH DIFFERENTIAL/PLATELET  BASIC METABOLIC PANEL  I-STAT BETA HCG BLOOD, ED ([facility], [facility], [facility] ONLY)    EKG None  Radiology MR Brain W and Wo Contrast  Result Date: 05/07/2022 CLINICAL DATA:  Headache, new or worsening, neuro deficit. Diplopia. EXAM: MRI HEAD WITHOUT AND WITH CONTRAST TECHNIQUE: Multiplanar, multiecho pulse sequences of the brain and surrounding structures were obtained without and with intravenous contrast. CONTRAST:  56mL GADAVIST GADOBUTROL 1 MMOL/ML IV SOLN COMPARISON:  None Available. FINDINGS: There is a large area of magnetic susceptibility artifact due to braces. Brain: No acute infarct, mass effect or extra-axial collection. No acute or chronic hemorrhage. Normal white matter signal, parenchymal volume and CSF spaces. The midline structures are normal. There is no abnormal contrast enhancement. Vascular: Major flow voids are preserved. Skull and upper cervical spine: Normal calvarium and skull base. Visualized upper cervical spine and soft tissues are normal. Sinuses/Orbits:No paranasal sinus fluid levels or advanced mucosal thickening. No mastoid or middle ear effusion. Normal orbits. IMPRESSION: Large area of magnetic susceptibility artifact due to braces. Within that limitation, normal MRI of the brain. Electronically Signed   By: Deatra Robinson M.D.   On: 05/07/2022 20:28    Procedures Procedures    Medications Ordered in ED Medications  diphenhydrAMINE (BENADRYL) injection 25 mg (25 mg Intravenous Given  05/07/22 1831)  ketorolac (TORADOL) 30 MG/ML injection 15 mg (15 mg Intravenous Given 05/07/22 1832)  acetaminophen (TYLENOL) tablet 1,000 mg (1,000 mg Oral Given 05/07/22 1833)  gadobutrol (GADAVIST) 1 MMOL/ML injection 8 mL (8 mLs Intravenous Contrast Given 05/07/22 1953)    ED Course/ Medical Decision Making/ A&P                           Medical Decision Making Amount and/or Complexity of Data Reviewed Radiology: ordered.  Risk OTC drugs. Prescription drug management.    Medical Screen Complete  This patient presented to the ED with complaint of blurry vision, bilateral.  This complaint involves an extensive number of treatment options. The initial differential diagnosis includes, but is not limited to, intracranial process, etc  This presentation is: Acute, Self-Limited, Previously Undiagnosed, Uncertain Prognosis, and Complicated  Patient presents with complaint of bilateral blurry vision that is mild.  She also reports mild diplopia with extreme lateral gaze to the left.  Patient without evidence of misaligned gaze on evaluation.  Patient's reported visual blurriness is mild.  She does have associated with headache.  MRI brain imaging obtained is without significant acute abnormality.  Other screening labs are without significant abnormality.  Patient was treated for migraine type pain.  After evaluation she reports significant improvement with resolution of headache.  She reports continued mild bilateral blurry vision with improvement in reported diplopia with extreme lateral gaze to the left.  Patient desires discharge home.  She does understand that she should follow-up closely with ophthalmology tomorrow morning.  Presentation today is perhaps most consistent with complex migraine given patient's reported prior history of migraine and associated visual symptoms today.  However, patient does understand that close ophthalmologic follow-up and evaluation is  warranted.    Additional history obtained:  External records from outside sources obtained and reviewed including prior ED visits and prior Inpatient records.    Lab Tests:  I ordered and personally interpreted labs.  The pertinent results include: CBC, BMP, hCG   Imaging Studies ordered:  I ordered imaging studies including MRI brain with and without I independently visualized and interpreted obtained imaging which showed NAD I agree with the radiologist interpretation.   Cardiac Monitoring:  The patient was maintained on a cardiac monitor.  I personally viewed and interpreted the cardiac monitor which showed an underlying rhythm of: NSR   Medicines ordered:  I ordered medication including Tylenol ,Toradol, Benadryl for headache Reevaluation of the patient after these medicines showed that the patient: improved  Problem List / ED Course:  Blurry vision, headache   Reevaluation:  After the interventions noted above, I reevaluated the patient and found that they have: improved    Disposition:  After consideration of the diagnostic results and the patients response to treatment, I feel that the patent would benefit from close outpatient followup.          Final Clinical Impression(s) / ED Diagnoses Final diagnoses:  Blurred vision, bilateral  Nonintractable headache, unspecified chronicity pattern, unspecified headache type    Rx / DC Orders ED Discharge Orders     None         Wynetta Fines, MD 05/07/22 2120

## 2022-05-07 NOTE — ED Provider Triage Note (Signed)
Emergency Medicine Provider Triage Evaluation Note  Charlotte Cohen , a 21 y.o. female  was evaluated in triage.  Pt complains of diplopia that started around 3 AM this morning.  Patient states left eye is worse than right.  She notes she has glasses however, does not wear them all the time.  No speech changes or unilateral weakness.  Admits to some pain around her left eye.  History of migraines.  Review of Systems  Positive: Visual changes Negative: vomiting  Physical Exam  BP 131/89 (BP Location: Right Arm)   Pulse 86   Temp 97.8 F (36.6 C) (Oral)   Resp 16   SpO2 100%  Gen:   Awake, no distress   Resp:  Normal effort  MSK:   Moves extremities without difficulty  Other:  Normal speech, no facial droop, no pronator drift.  Equal grip strength.  Medical Decision Making  Medically screening exam initiated at 1:32 PM.  Appropriate orders placed.  Charlotte Cohen was informed that the remainder of the evaluation will be completed by another provider, this initial triage assessment does not replace that evaluation, and the importance of remaining in the ED until their evaluation is complete.  Normal CT head a few months ago Routine labs ordered   Charlotte Cohen 05/07/22 1333

## 2022-05-07 NOTE — Discharge Instructions (Signed)
Return for any problem.   Follow-up with ophthalmology -Dr. Darcel Bayley at [facility] - as instructed.

## 2022-05-07 NOTE — ED Triage Notes (Signed)
Patient here with complaint of diplopia that was present on waking this morning at 0300, LKW 2100 last night. No facial droop, no ataxia, no arm drift, sensation equal bilaterally in face, arms, and legs. Patient is alert, oriented, ambulatory, and in no apparent distress at this time.

## 2022-05-09 DIAGNOSIS — H4922 Sixth [abducent] nerve palsy, left eye: Secondary | ICD-10-CM | POA: Diagnosis not present

## 2022-06-25 ENCOUNTER — Ambulatory Visit
Admission: EM | Admit: 2022-06-25 | Discharge: 2022-06-25 | Disposition: A | Discharge status: Home / Self Care | Payer: Self-pay | Attending: Physician Assistant | Admitting: Physician Assistant

## 2022-06-25 DIAGNOSIS — N912 Amenorrhea, unspecified: Secondary | ICD-10-CM

## 2022-06-25 LAB — POCT URINE PREGNANCY: Preg Test, Ur: POSITIVE — AB

## 2022-06-25 NOTE — ED Provider Notes (Signed)
[facility]    CSN: [id number] Arrival date & time: 06/25/22  1223      History   Chief Complaint Chief Complaint  Patient presents with   Abdominal Pain    HPI Charlotte Cohen is a 21 y.o. female.   21 year old female presents for pregnancy test.  Patient indicates for the past 2 weeks she has been having intermittent episodes of abdominal cramping which can be mild to moderately severe.  She also indicates at the same times that she has been having increased breast tenderness and soreness, intermittent headaches, and she has noticed that she has been coming more emotionally fragile.  She relates that she has been having episodes to where she cries frequently, or can get angry at issues that are not important.  She is concerned that she may be pregnant because her last period was April 26, 2022.  She relates that she is performed for OTC pregnancy test each 1 being a different type in all 4 were positive.  She relates that she did the pregnancy test about 3 days ago.  She indicates that she is not having any fever, chills, nausea or vomiting, and no vaginal bleeding.  She relates she does not have any frequency, urgency, or dysuria. EGA: 8 weeks 2 days, EDD: Feb 01, 2023.   Abdominal Pain   Past Medical History:  Diagnosis Date   Allergy    Asthma     There are no problems to display for this patient.   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      Home Medications    Prior to Admission medications   Medication Sig Start Date End Date Taking? Authorizing Provider  cetirizine (ZYRTEC) 10 MG tablet Take 1 tablet (10 mg total) by mouth daily. 01/16/22   Georganna Skeans, MD  esomeprazole (NEXIUM) 40 MG capsule Take 1 capsule (40 mg total) by mouth daily. 02/04/22   Wallis Bamberg, PA-C  fluticasone (FLONASE) 50 MCG/ACT nasal spray  Place 2 sprays into both nostrils daily. 05/15/21   Georganna Skeans, MD  montelukast (SINGULAIR) 10 MG tablet Take 1 tablet (10 mg total) by mouth at bedtime. 02/04/22   Wallis Bamberg, PA-C  naproxen (NAPROSYN) 500 MG tablet Take 1 tablet (500 mg total) by mouth 2 (two) times daily with a meal. 02/16/22   Eber Hong, MD  predniSONE (DELTASONE) 10 MG tablet Take 3 tablets (30 mg total) by mouth daily with breakfast. Patient not taking: Reported on 02/13/2022 02/04/22   Wallis Bamberg, PA-C  SUMAtriptan (IMITREX) 25 MG tablet Take 2 tablets (50 mg total) by mouth every 2 (two) hours as needed for migraine (ongoing headache). Maximum daily dose 200mg  02/16/22   04/18/22, MD  tiZANidine (ZANAFLEX) 4 MG tablet Take 1 tablet (4 mg total) by mouth at bedtime. Patient not taking: Reported on 02/13/2022 02/04/22   02/06/22, PA-C    Family History Family History  Problem Relation Age of Onset   Cancer Maternal Grandmother     Social History Social History   Tobacco Use   Smoking status: Never   Smokeless tobacco: Never  Vaping Use   Vaping Use: Never used  Substance Use Topics   Alcohol use: Never   Drug use: Never     Allergies   Patient has no known allergies.  Review of Systems Review of Systems  Gastrointestinal:  Positive for abdominal pain (cramping).  Genitourinary:  Positive for menstrual problem (no period since 04/26/2022.).  Neurological:  Positive for headaches.     Physical Exam Triage Vital Signs ED Triage Vitals [06/25/22 1236]  Enc Vitals Group     BP 125/80     Pulse Rate 80     Resp 16     Temp 97.8 F (36.6 C)     Temp Source Oral     SpO2 97 %     Weight      Height      Head Circumference      Peak Flow      Pain Score 10     Pain Loc      Pain Edu?      Excl. in GC?    No data found.  Updated Vital Signs BP 125/80 (BP Location: Left Arm)   Pulse 80   Temp 97.8 F (36.6 C) (Oral)   Resp 16   SpO2 97%   Visual Acuity Right Eye Distance:    Left Eye Distance:   Bilateral Distance:    Right Eye Near:   Left Eye Near:    Bilateral Near:     Physical Exam Constitutional:      Appearance: She is well-developed.  Cardiovascular:     Rate and Rhythm: Normal rate and regular rhythm.     Heart sounds: Normal heart sounds.  Pulmonary:     Effort: Pulmonary effort is normal.     Breath sounds: Normal breath sounds and air entry. No wheezing, rhonchi or rales.  Abdominal:     General: Abdomen is flat. Bowel sounds are normal.     Palpations: Abdomen is soft.     Tenderness: There is abdominal tenderness (mild) in the suprapubic area.  Neurological:     Mental Status: She is alert.      UC Treatments / Results  Labs (all labs ordered are listed, but only abnormal results are displayed) Labs Reviewed  POCT URINE PREGNANCY - Abnormal; Notable for the following components:      Result Value   Preg Test, Ur Positive (*)    All other components within normal limits    EKG   Radiology No results found.  Procedures Procedures (including critical care time)  Medications Ordered in UC Medications - No data to display  Initial Impression / Assessment and Plan / UC Course  I have reviewed the triage vital signs and the nursing notes.  Pertinent labs & imaging results that were available during my care of the patient were reviewed by me and considered in my medical decision making (see chart for details).    Plan: 1.  The amenorrhea will be treated with the following: A.  No treatment needed since amenorrhea is due to pregnancy. B.  [facility] to the [facility] so the patient can become established with an OB/GYN. C.  Patient advised to continue taking a multivitamin daily. 2.  Patient advised to follow-up with PCP or return to urgent care if symptoms fail to improve or if there are any problems she is report to the women [facility]. Final Clinical Impressions(s) / UC Diagnoses   Final  diagnoses:  Amenorrhea     Discharge Instructions      Advised to continue taking multivitamins on a daily basis. The pregnancy test is positive, estimated gestational age is 8 weeks and 2 days.  The estimated delivery date is May 19 or 20, 2024.  Internal referral has been made to the women[facility] center.  They should be calling you within the next 48 hours to arrange an appointment to be seen and evaluated.  Advised to follow-up with PCP or return to urgent care if symptoms fail to improve.       ED Prescriptions   None    PDMP not reviewed this encounter.   Nyoka Lint, PA-C 06/25/22 1300

## 2022-06-25 NOTE — Discharge Instructions (Addendum)
Advised to continue taking multivitamins on a daily basis. The pregnancy test is positive, estimated gestational age is 8 weeks and 2 days.  The estimated delivery date is May 19 or 20, 2024.  Internal referral has been made to the women's center.  They should be calling you within the next 48 hours to arrange an appointment to be seen and evaluated.  Advised to follow-up with PCP or return to urgent care if symptoms fail to improve.

## 2022-06-25 NOTE — ED Triage Notes (Signed)
Pt c/o sore breasts, abd cramps, headache  LMP in August.

## 2022-07-14 ENCOUNTER — Ambulatory Visit
Admission: EM | Admit: 2022-07-14 | Discharge: 2022-07-14 | Disposition: A | Discharge status: Home / Self Care | Payer: Medicaid Other

## 2022-07-14 ENCOUNTER — Inpatient Hospital Stay (HOSPITAL_COMMUNITY)
Admission: AD | Admit: 2022-07-14 | Discharge: 2022-07-14 | Disposition: A | Discharge status: Home / Self Care | Payer: Medicaid Other | Attending: Family Medicine | Admitting: Family Medicine

## 2022-07-14 ENCOUNTER — Encounter (HOSPITAL_COMMUNITY): Payer: Self-pay | Admitting: Family Medicine

## 2022-07-14 ENCOUNTER — Encounter: Payer: Self-pay | Admitting: Emergency Medicine

## 2022-07-14 ENCOUNTER — Ambulatory Visit: Payer: Medicaid Other

## 2022-07-14 DIAGNOSIS — O26891 Other specified pregnancy related conditions, first trimester: Secondary | ICD-10-CM | POA: Diagnosis not present

## 2022-07-14 DIAGNOSIS — O99351 Diseases of the nervous system complicating pregnancy, first trimester: Secondary | ICD-10-CM | POA: Diagnosis not present

## 2022-07-14 DIAGNOSIS — G43811 Other migraine, intractable, with status migrainosus: Secondary | ICD-10-CM

## 2022-07-14 DIAGNOSIS — R55 Syncope and collapse: Secondary | ICD-10-CM

## 2022-07-14 DIAGNOSIS — R200 Anesthesia of skin: Secondary | ICD-10-CM | POA: Diagnosis not present

## 2022-07-14 DIAGNOSIS — Z3A09 9 weeks gestation of pregnancy: Secondary | ICD-10-CM | POA: Diagnosis not present

## 2022-07-14 DIAGNOSIS — Z3A10 10 weeks gestation of pregnancy: Secondary | ICD-10-CM

## 2022-07-14 LAB — COMPREHENSIVE METABOLIC PANEL
ALT: 12 U/L (ref 0–44)
AST: 18 U/L (ref 15–41)
Albumin: 4.2 g/dL (ref 3.5–5.0)
Alkaline Phosphatase: 47 U/L (ref 38–126)
Anion gap: 9 (ref 5–15)
BUN: 8 mg/dL (ref 6–20)
CO2: 22 mmol/L (ref 22–32)
Calcium: 9.5 mg/dL (ref 8.9–10.3)
Chloride: 106 mmol/L (ref 98–111)
Creatinine, Ser: 0.71 mg/dL (ref 0.44–1.00)
GFR, Estimated: >60 mL/min (ref >60)
Glucose, Bld: 80 mg/dL (ref 70–99)
Potassium: 3.5 mmol/L (ref 3.5–5.1)
Sodium: 137 mmol/L (ref 135–145)
Total Bilirubin: 0.5 mg/dL (ref 0.3–1.2)
Total Protein: 7 g/dL (ref 6.5–8.1)

## 2022-07-14 LAB — CBC WITH DIFFERENTIAL/PLATELET
Abs Immature Granulocytes: 0.05 10*3/uL (ref 0.00–0.07)
Basophils Absolute: 0.1 10*3/uL (ref 0.0–0.1)
Basophils Relative: 1 %
Eosinophils Absolute: 0.3 10*3/uL (ref 0.0–0.5)
Eosinophils Relative: 2 %
HCT: 40.5 % (ref 36.0–46.0)
Hemoglobin: 13.8 g/dL (ref 12.0–15.0)
Immature Granulocytes: 0 %
Lymphocytes Relative: 24 %
Lymphs Abs: 3 10*3/uL (ref 0.7–4.0)
MCH: 30.8 pg (ref 26.0–34.0)
MCHC: 34.1 g/dL (ref 30.0–36.0)
MCV: 90.4 fL (ref 80.0–100.0)
Monocytes Absolute: 0.8 10*3/uL (ref 0.1–1.0)
Monocytes Relative: 7 %
Neutro Abs: 8.3 10*3/uL — ABNORMAL HIGH (ref 1.7–7.7)
Neutrophils Relative %: 66 %
Platelets: 301 10*3/uL (ref 150–400)
RBC: 4.48 MIL/uL (ref 3.87–5.11)
RDW: 13.3 % (ref 11.5–15.5)
WBC: 12.5 10*3/uL — ABNORMAL HIGH (ref 4.0–10.5)
nRBC: 0 % (ref 0.0–0.2)

## 2022-07-14 LAB — URINALYSIS, ROUTINE W REFLEX MICROSCOPIC
Bilirubin Urine: NEGATIVE
Glucose, UA: NEGATIVE mg/dL
Ketones, ur: NEGATIVE mg/dL
Nitrite: NEGATIVE
Specific Gravity, Urine: 1.025 (ref 1.005–1.030)
pH: 6.5 (ref 5.0–8.0)

## 2022-07-14 LAB — URINALYSIS, MICROSCOPIC (REFLEX)

## 2022-07-14 MED ORDER — METOCLOPRAMIDE HCL 5 MG/ML IJ SOLN
20.0000 mg | Freq: Once | INTRAVENOUS | Status: AC
  Administered 2022-07-14: 20 mg via INTRAVENOUS
  Filled 2022-07-14: qty 4

## 2022-07-14 MED ORDER — DOXYLAMINE-PYRIDOXINE 10-10 MG PO TBEC: 2.0000 | DELAYED_RELEASE_TABLET | Freq: Every day | ORAL | 5 refills | Status: DC

## 2022-07-14 MED ORDER — DIPHENHYDRAMINE HCL 50 MG/ML IJ SOLN
25.0000 mg | Freq: Once | INTRAMUSCULAR | Status: AC
  Administered 2022-07-14: 25 mg via INTRAVENOUS
  Filled 2022-07-14: qty 1

## 2022-07-14 MED ORDER — LACTATED RINGERS IV BOLUS
1000.0000 mL | Freq: Once | INTRAVENOUS | Status: AC
  Administered 2022-07-14: 1000 mL via INTRAVENOUS

## 2022-07-14 NOTE — ED Provider Notes (Addendum)
Patient here today for evaluation of headache, right sided facial numbness and possible syncopal episode earlier this morning. She reports that she is not sure how she collapsed and is not sure if she had LOC. She states she "completely blacked out" She did take tylenol for headache this morning. She is currently [redacted] weeks pregnant. Due to pregnancy , possible LOC, etc recommended further evaluation in the ED for stat labs, etc. Patient expresses understanding. Husband to transport via [place].     Francene Finders, PA-C 07/14/22 1110    Francene Finders, PA-C 07/14/22 [phone]

## 2022-07-14 NOTE — ED Triage Notes (Signed)
Pt is present today with c/o HA and right side facial numbness. Pt states that she collapsed this morning. Pt states that she cannot remember dif she completely blacked out. Pt states that she took a dose of tylenol around 6am this morning.

## 2022-07-14 NOTE — MAU Note (Signed)
.  Charlotte Cohen is a 21 y.o. at [redacted]w[redacted]d here in MAU reporting: was seen in urgent care for Migraine that she has had for 3-4 days.  Got up this morning and collapsed on the floor. Felt very dizzy and weak. She is not sure if she lost conscienceness . Reports the right side of her face feels numb. Has felt that way for 4 days since the start of the migraine. Has taking tylenol without relief. Last time she took it was 6am. Headache pain Is mostly on the right side and radiates to her neck . Nuero check intact . No facial droop or tongue deviation noted. Hand grasps and foot pushes strong and equal bilaterally.  Had reported some chest pain and discomfort last week but none today. LMP: 05/06/22 Onset of complaint: 4 days Pain score: 9 Vitals:   07/14/22 1244  BP: 124/67  Pulse: 87  Resp: 18  Temp: 98.1 F (36.7 C)     FHT:n/a Lab orders placed from triage:  u/a

## 2022-07-14 NOTE — ED Notes (Signed)
Patient is being discharged from the Urgent Care and sent to the Emergency Department via POV . Per provider, patient is in need of higher level of care due to LOC right facial numbness . Patient is aware and verbalizes understanding of plan of care.  Vitals:   07/14/22 1102  BP: 113/71  Pulse: 88  Resp: 18  Temp: 98.3 F (36.8 C)  SpO2: 94%

## 2022-07-14 NOTE — MAU Provider Note (Signed)
History     CSN: [id number]  Arrival date and time: 07/14/22 1218    Chief Complaint  Patient presents with   Headache   HPI 21 yo G1P0 at 9 weeks and 6 days with a history of migraines.  She presents with a 4-day migraine that has not improved despite Tylenol and sleep.  Headache is worse with loud noises and bright lights.  It is similar nature to her previous headaches.  She did have an episode where she collapsed on the ground the morning.  She not aware if she lost consciousness. It took her a few minutes to get up from the ground. Did not hit her head.  Her appetite has been poor for the past couple of weeks with diminished oral food and liquid intake, which is a result of combination of migraine as well as morning sickness.  She does report some mild upper face numbness which is new.   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 90 tablet 1 07/11/2022   esomeprazole (NEXIUM) 40 MG capsule Take 1 capsule (40 mg total) by mouth daily. 30 capsule 0    fluticasone (FLONASE) 50 MCG/ACT nasal spray Place 2 sprays into both nostrils daily. 16 g 2 07/12/2022   montelukast (SINGULAIR) 10 MG tablet Take 1  tablet (10 mg total) by mouth at bedtime. 90 tablet 0 07/12/2022   naproxen (NAPROSYN) 500 MG tablet Take 1 tablet (500 mg total) by mouth 2 (two) times daily with a meal. 30 tablet 0    predniSONE (DELTASONE) 10 MG tablet Take 3 tablets (30 mg total) by mouth daily with breakfast. (Patient not taking: Reported on 02/13/2022) 15 tablet 0    SUMAtriptan (IMITREX) 25 MG tablet Take 2 tablets (50 mg total) by mouth every 2 (two) hours as needed for migraine (ongoing headache). Maximum daily dose 200mg  30 tablet 0    tiZANidine (ZANAFLEX) 4 MG tablet Take 1 tablet (4 mg total) by mouth at bedtime. (Patient not taking: Reported on 02/13/2022) 30 tablet 0     Review of Systems Physical Exam   Blood pressure 123/77, pulse 90, temperature 98.1 F (36.7 C), resp. rate 18, height 5\' 3"  (1.6 m), weight 87.1 kg, last menstrual period 05/06/2022, SpO2 98 %.  Physical  Exam Vitals and nursing note reviewed.  Constitutional:      Appearance: She is well-developed.  HENT:     Head: Normocephalic and atraumatic.  Eyes:     General: No visual field deficit. Cardiovascular:     Rate and Rhythm: Normal rate and regular rhythm.  Pulmonary:     Effort: Pulmonary effort is normal.     Breath sounds: Normal breath sounds.  Neurological:     Mental Status: She is alert and oriented to person, place, and time.     Cranial Nerves: No cranial nerve deficit, dysarthria or facial asymmetry.     Sensory: Sensory deficit (mild numbness in V1 dermatome) present.     Motor: No weakness.     Coordination: Romberg sign negative. Coordination normal.  Psychiatric:        Mood and Affect: Mood normal. Mood is not anxious.        Speech: Speech normal.        Behavior: Behavior normal. Behavior is not agitated.        Cognition and Memory: Memory is not impaired.    MAU Course  Procedures EKG interpreted by me: Sinus rhythm of 76. No acute ST changes.   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  At this point, no  need for rpt imaging - had MRI in 04/2022 which was normal. EKG normal Check labs for electrolyte abnormality. IV fluids IV reglan and benadryl. If this isn't effective, will give dose of caffeine.  Assessment and Plan   1. [redacted] weeks gestation of pregnancy   2. Other migraine with status migrainosus, intractable   3. Syncope, unspecified syncope type    Headache completely resolved after IV Reglan and Benadryl. Labs normal. We will send prescription for Diclegis to the pharmacy.  Follow-up for new OB appointment.  Truett Mainland 07/14/2022, 1:28 PM

## 2022-07-15 LAB — CULTURE, OB URINE

## 2022-07-17 ENCOUNTER — Telehealth: Payer: Medicaid Other

## 2022-07-18 ENCOUNTER — Ambulatory Visit (INDEPENDENT_AMBULATORY_CARE_PROVIDER_SITE_OTHER): Payer: Medicaid Other

## 2022-07-18 VITALS — Wt 192.0 lb

## 2022-07-18 DIAGNOSIS — Z3490 Encounter for supervision of normal pregnancy, unspecified, unspecified trimester: Secondary | ICD-10-CM | POA: Insufficient documentation

## 2022-07-18 DIAGNOSIS — Z3480 Encounter for supervision of other normal pregnancy, unspecified trimester: Secondary | ICD-10-CM | POA: Insufficient documentation

## 2022-07-18 DIAGNOSIS — Z369 Encounter for antenatal screening, unspecified: Secondary | ICD-10-CM

## 2022-07-18 DIAGNOSIS — Z3689 Encounter for other specified antenatal screening: Secondary | ICD-10-CM

## 2022-07-18 NOTE — Progress Notes (Signed)
New OB Intake  I connected with  Zollie Beckers on 07/18/22 at  9:15 AM EDT by telephone and verified that I am speaking with the correct person using two identifiers. Nurse is located at [employer] and pt is located at home.  I explained I am completing New OB Intake today. We discussed her EDD of 02/10/2023 that is based on LMP of 05/06/2022. Pt is G1/P0. I reviewed her allergies, medications, Medical/Surgical/OB history, and appropriate screenings. Based on history, this is a/an pregnancy uncomplicated .   There are no problems to display for this patient.   Concerns addressed today Migraine; adv given per new protocol; if doesn't help by Monday pt will call and ask for a rx.  Delivery Plans:  Plans to deliver at [facility].  Not sure - lives in [place]. Aware if deliveres in [place] she will have someone deliver her she hasn't met before.  Pt states a friend recommended [place] to her.  Anatomy US Explained first scheduled [place] will be scheduled soon and an anatomy scan will be done at 20 weeks.  Labs Discussed genetic screening with patient. Patient desires genetic testing to be drawn at new OB visit. Discussed possible labs to be drawn at new OB appointment.  COVID Vaccine Patient has had COVID vaccine.   Social Determinants of Health Food Insecurity: denies food insecurity Transportation: Patient denies transportation needs.  First visit review I reviewed new OB appt with pt. I explained she will have ob bloodwork and pap smear/pelvic exam if indicated. Explained pt will be seen by Roberto Scales, CNM at first visit; encounter routed to appropriate provider.   Cleophas Dunker, [facility] 07/18/2022  9:41 AM

## 2022-07-21 ENCOUNTER — Other Ambulatory Visit: Payer: Medicaid Other

## 2022-07-21 DIAGNOSIS — Z369 Encounter for antenatal screening, unspecified: Secondary | ICD-10-CM

## 2022-07-21 DIAGNOSIS — Z3480 Encounter for supervision of other normal pregnancy, unspecified trimester: Secondary | ICD-10-CM | POA: Diagnosis not present

## 2022-07-28 LAB — CBC/D/PLT+RPR+RH+ABO+RUBIGG...
Antibody Screen: NEGATIVE
Basophils Absolute: 0.1 10*3/uL (ref 0.0–0.2)
Basos: 1 %
EOS (ABSOLUTE): 0.3 10*3/uL (ref 0.0–0.4)
Eos: 3 %
HCV Ab: NONREACTIVE
HIV Screen 4th Generation wRfx: NONREACTIVE
Hematocrit: 39.4 % (ref 34.0–46.6)
Hemoglobin: 13.5 g/dL (ref 11.1–15.9)
Hepatitis B Surface Ag: NEGATIVE
Immature Grans (Abs): 0 10*3/uL (ref 0.0–0.1)
Immature Granulocytes: 0 %
Lymphocytes Absolute: 2.5 10*3/uL (ref 0.7–3.1)
Lymphs: 22 %
MCH: 31.1 pg (ref 26.6–33.0)
MCHC: 34.3 g/dL (ref 31.5–35.7)
MCV: 91 fL (ref 79–97)
Monocytes Absolute: 0.9 10*3/uL (ref 0.1–0.9)
Monocytes: 8 %
Neutrophils Absolute: 7.6 10*3/uL — ABNORMAL HIGH (ref 1.4–7.0)
Neutrophils: 66 %
Platelets: 294 10*3/uL (ref 150–450)
RBC: 4.34 x10E6/uL (ref 3.77–5.28)
RDW: 13.1 % (ref 11.7–15.4)
RPR Ser Ql: NONREACTIVE
Rh Factor: POSITIVE
Rubella Antibodies, IGG: 1.79 index (ref >0.99)
Varicella zoster IgG: <135 index — ABNORMAL LOW (ref >165)
WBC: 11.4 10*3/uL — ABNORMAL HIGH (ref 3.4–10.8)

## 2022-07-28 LAB — MATERNIT 21 PLUS CORE, BLOOD
Fetal Fraction: 6
Result (T21): NEGATIVE
Trisomy 13 (Patau syndrome): NEGATIVE
Trisomy 18 (Edwards syndrome): NEGATIVE
Trisomy 21 (Down syndrome): NEGATIVE

## 2022-07-28 LAB — HCV INTERPRETATION

## 2022-07-29 ENCOUNTER — Other Ambulatory Visit (HOSPITAL_COMMUNITY)
Admission: RE | Admit: 2022-07-29 | Discharge: 2022-07-29 | Disposition: A | Discharge status: Home / Self Care | Payer: Medicaid Other | Source: Ambulatory Visit | Attending: Licensed Practical Nurse | Admitting: Licensed Practical Nurse

## 2022-07-29 ENCOUNTER — Ambulatory Visit (INDEPENDENT_AMBULATORY_CARE_PROVIDER_SITE_OTHER): Payer: Medicaid Other

## 2022-07-29 ENCOUNTER — Ambulatory Visit (INDEPENDENT_AMBULATORY_CARE_PROVIDER_SITE_OTHER): Payer: Medicaid Other | Admitting: Licensed Practical Nurse

## 2022-07-29 VITALS — BP 113/80 | HR 98 | Wt 189.0 lb

## 2022-07-29 DIAGNOSIS — Z3480 Encounter for supervision of other normal pregnancy, unspecified trimester: Secondary | ICD-10-CM

## 2022-07-29 DIAGNOSIS — Z124 Encounter for screening for malignant neoplasm of cervix: Secondary | ICD-10-CM

## 2022-07-29 DIAGNOSIS — Z3687 Encounter for antenatal screening for uncertain dates: Secondary | ICD-10-CM | POA: Diagnosis not present

## 2022-07-29 DIAGNOSIS — Z3A09 9 weeks gestation of pregnancy: Secondary | ICD-10-CM

## 2022-07-29 DIAGNOSIS — O219 Vomiting of pregnancy, unspecified: Secondary | ICD-10-CM

## 2022-07-29 DIAGNOSIS — Z0283 Encounter for blood-alcohol and blood-drug test: Secondary | ICD-10-CM | POA: Diagnosis not present

## 2022-07-29 DIAGNOSIS — Z369 Encounter for antenatal screening, unspecified: Secondary | ICD-10-CM

## 2022-07-29 MED ORDER — ONDANSETRON 4 MG PO TBDP: 4.0000 mg | ORAL_TABLET | Freq: Four times a day (QID) | ORAL | 3 refills | Status: DC | PRN

## 2022-07-29 NOTE — Progress Notes (Signed)
zofran  New Obstetric Patient H&P    Chief Complaint: "Desires prenatal care"   History of Present Illness: Patient is a 21 y.o. G1P0 Hispanic or Latino female, presents with amenorrhea and positive home pregnancy test. Patient's last menstrual period was 05/06/2022 (exact date). and based on her  [place] on 11/14 her EDD is Estimated Date of Delivery: 02/27/23 and her EGA is [redacted]w[redacted]d. Cycles are 10. days, irregular, and occur approximately every : 2-3 months.  Her last pap smear was collected today. This was a surprise pregnancy, she assumed she could not get pregnant d/t her irregular cycles. She is both excited and scared.    .Since her LMP she claims she has experienced nausea, vomiting, She can only tolerate apple juice and [employer], water makes her ill..has had burning after sex, labial swelling, and a thick green discharge without an odor.  She denies vaginal bleeding. Her past medical history is contibutory. Hx Asthma, does not have an inhaler was hospitalized 2-3 months ago for asthma. 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 Uses a breathing machine when she has asthma attacks.  Her prior pregnancies are notable for none  Since her LMP, she admits to the use of tobacco products  no She claims she has gained    unsure  pounds since the start of her pregnancy.  There are cats in the home in the home  no  She admits close contact with children on a regular basis  yes has a step child She has had chicken pox in the past  unsure She has had Tuberculosis exposures, symptoms, or previously tested positive for TB   no Current or past history of domestic violence. no  Genetic Screening/Teratology Counseling: (Includes patient, baby's father, or anyone in either family with:)   1. Patient's age >/= 4 at [facility]  no 2. Thalassemia ([place], [place], Mediterranean, or Asian background): MCV<80  no 3. Neural tube defect (meningomyelocele, spina bifida, anencephaly)  no 4. Congenital heart defect  no  5. Down  syndrome  no 6. Tay-Sachs (Jewish, [place]  no 7. Canavan's Disease  no 8. Sickle cell disease or trait (African)  no  9. Hemophilia or other blood disorders  no  10. Muscular dystrophy  no  11. Cystic fibrosis  no  12. Huntington's Chorea  no  13. Mental retardation/autism  no 14. Other inherited genetic or chromosomal disorder  no 15. Maternal metabolic disorder (DM, PKU, etc)  no 16. Patient or FOB with a child with a birth defect not listed above no  16a. Patient or FOB with a birth defect themselves no 17. Recurrent pregnancy loss, or stillbirth  no  18. Any medications since LMP other than prenatal vitamins (include vitamins, supplements, OTC meds, drugs, alcohol)  no 19. Any other genetic/environmental exposure to discuss  no  Infection History:   1. Lives with someone with TB or TB exposed  no  2. Patient or partner has history of genital herpes  no 3. Rash or viral illness since LMP  no 4. History of STI (GC, CT, HPV, syphilis, HIV)  no 5. 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 and 2 children age 25 and 5, feels safe  Denies hx depression or anxiety    Review of Systems:10 point review of systems negative unless otherwise noted in HPI  Past Medical History:  Patient Active Problem List   Diagnosis Date Noted   Supervision  of other normal pregnancy, antepartum 07/18/2022     Clinical Staff Provider  Office Location  [facility] Dating  02/10/2023, by Last Menstrual Period  Language  English Anatomy US    Flu Vaccine  offer Genetic Screen  NIPS:   TDaP vaccine   offer Hgb A1C or  GTT Early : Third trimester :   Covid No boosters   LAB RESULTS   Rhogam     Blood Type     Feeding Plan breast Antibody    Contraception IUD Rubella    Circumcision no RPR     Pediatrician  undecided HBsAg     Support Person Luis HIV    Prenatal Classes no Varicella     GBS  (For PCN allergy, check  sensitivities)   BTL Consent  Hep C     VBAC Consent  Pap No results found for: "DIAGPAP"    Hgb Electro      CF      SMA             Past Surgical History:  Past Surgical History:  Procedure Laterality Date   BARIATRIC SURGERY N/A 2022   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  Obstetric History: G1P0  Family History:  Family History  Problem Relation Age of Onset   Healthy Mother    Healthy Father    Healthy Sister    Healthy Sister    Healthy Brother    Healthy Brother    Healthy Brother    Eczema Brother    Cancer Maternal Grandmother        vaginal/uterine   Eczema Maternal Grandfather    Eczema Paternal Grandmother    Eczema Paternal Grandfather     Social History:  Social History   Socioeconomic History   Marital status: Married    Spouse name: Luis   Number of children: 0   Years of education: 12   Highest education level: Not on file  Occupational History   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  Tobacco Use   Smoking status: Never   Smokeless tobacco: Never  Vaping Use   Vaping Use: Never used  Substance and Sexual Activity   Alcohol use: Never   Drug use: Never   Sexual activity: Yes    Partners: Male    Birth control/protection: None  Other Topics Concern   Not on file  Social History Narrative   Not on file   Social Determinants of Health   Financial Resource Strain: Medium Risk (07/18/2022)   Overall Financial Resource Strain (CARDIA)    Difficulty of Paying Living Expenses: Somewhat hard  Food Insecurity: No Food Insecurity (07/18/2022)   Hunger Vital Sign    Worried About Running Out of Food in the Last Year: Never true    Ran Out of Food in the Last Year: Never true  Transportation Needs: No Transportation Needs (07/18/2022)   PRAPARE - Administrator, Civil Service (Medical): No    Lack of  Transportation (Non-Medical): No  Physical Activity: Insufficiently Active (07/18/2022)   Exercise Vital Sign    Days of Exercise per Week: 5 days    Minutes of Exercise per Session: 20 min  Stress: No Stress Concern Present (07/18/2022)   [employer] of Occupational Health - Occupational Stress Questionnaire    Feeling of Stress :  Not at all  Social Connections: Moderately Isolated (07/18/2022)   Social Connection and Isolation Panel [NHANES]    Frequency of Communication with Friends and Family: Once a week    Frequency of Social Gatherings with Friends and Family: Once a week    Attends Religious Services: More than 4 times per year    Active Member of [employer] or Organizations: No    Attends Banker Meetings: Never    Marital Status: Married  Catering manager Violence: Not At Risk (07/18/2022)   Humiliation, Afraid, Rape, and Kick questionnaire    Fear of Current or Ex-Partner: No    Emotionally Abused: No    Physically Abused: No    Sexually Abused: No    Allergies:  No Known Allergies  Medications: Prior to Admission medications   Medication Sig Start Date End Date Taking? Authorizing Provider  cetirizine (ZYRTEC) 10 MG tablet Take 1 tablet (10 mg total) by mouth daily. 01/16/22  Yes Georganna Skeans, MD  fluticasone [facility]) 50 MCG/ACT nasal spray Place 2 sprays into both nostrils daily. 05/15/21  Yes Georganna Skeans, MD  Prenatal Vit-Fe Fumarate-FA (MULTIVITAMIN-PRENATAL) 27-0.8 MG TABS tablet Take 1 tablet by mouth daily at 12 noon.   Yes [provider]    Physical Exam Vitals: Blood pressure 113/80, pulse 98, weight 189 lb (85.7 kg), last menstrual period 05/06/2022.  General: NAD HEENT: normocephalic, anicteric Thyroid: no enlargement, no palpable nodules Pulmonary: No increased work of breathing, CTAB Cardiovascular: RRR, distal pulses 2+ Abdomen: NABS, soft, non-tender, non-distended.  Umbilicus without lesions.  No hepatomegaly, splenomegaly  or masses palpable. No evidence of hernia  Genitourinary:  External: Normal external female genitalia irritated looking.  Normal urethral meatus, normal  Bartholin's and Skene's glands.    Vagina: Normal vaginal mucosa, no evidence of prolapse.    Cervix: Grossly normal in appearance, no bleeding. Thick white discharge present   Uterus: gravid, mobile, normal contour.  No CMT  Adnexa: ovaries non-enlarged, no adnexal masses  Rectal: deferred Extremities: no edema, erythema, or tenderness Neurologic: Grossly intact Psychiatric: mood appropriate, affect full   Assessment: 21 y.o. G1P0 at [redacted]w[redacted]d presenting to initiate prenatal care  Plan: 1) Avoid alcoholic beverages. 2) Patient encouraged not to smoke.  3) Discontinue the use of all non-medicinal drugs and chemicals.  4) Take prenatal vitamins daily.  5) Nutrition, food safety (fish, cheese advisories, and high nitrite foods) and exercise discussed. 6) Hospital and practice style discussed with cross coverage system.  7) Genetic Screening, such as with 1st Trimester Screening, cell free fetal DNA, AFP testing, and Ultrasound, as well as with amniocentesis and CVS as appropriate, is discussed with patient. At the conclusion of today's visit patient requested genetic testing 8) Patient is asked about travel to areas at risk for the Zika virus, and counseled to avoid travel and exposure to mosquitoes or sexual partners who may have themselves been exposed to the virus. Testing is discussed, and will be ordered as appropriate.   Script for Zofran sent Pap and remainder of NOB labs collected Will need nutrition labs at another visit.   Carie Caddy, CNM  [facility], [facility] 08/05/2022, 1:11 PM

## 2022-07-30 ENCOUNTER — Other Ambulatory Visit: Payer: Self-pay

## 2022-07-30 ENCOUNTER — Encounter (HOSPITAL_COMMUNITY): Payer: Self-pay | Admitting: Obstetrics and Gynecology

## 2022-07-30 ENCOUNTER — Inpatient Hospital Stay (HOSPITAL_COMMUNITY)
Admission: AD | Admit: 2022-07-30 | Discharge: 2022-07-30 | Disposition: A | Discharge status: Home / Self Care | Payer: Medicaid Other | Attending: Obstetrics and Gynecology | Admitting: Obstetrics and Gynecology

## 2022-07-30 DIAGNOSIS — O99511 Diseases of the respiratory system complicating pregnancy, first trimester: Secondary | ICD-10-CM | POA: Diagnosis present

## 2022-07-30 DIAGNOSIS — M940 Chondrocostal junction syndrome [Tietze]: Secondary | ICD-10-CM | POA: Diagnosis not present

## 2022-07-30 DIAGNOSIS — J4521 Mild intermittent asthma with (acute) exacerbation: Secondary | ICD-10-CM | POA: Diagnosis not present

## 2022-07-30 DIAGNOSIS — O98511 Other viral diseases complicating pregnancy, first trimester: Secondary | ICD-10-CM | POA: Insufficient documentation

## 2022-07-30 DIAGNOSIS — R1010 Upper abdominal pain, unspecified: Secondary | ICD-10-CM | POA: Diagnosis present

## 2022-07-30 DIAGNOSIS — Z3A09 9 weeks gestation of pregnancy: Secondary | ICD-10-CM | POA: Diagnosis not present

## 2022-07-30 DIAGNOSIS — Z1152 Encounter for screening for COVID-19: Secondary | ICD-10-CM | POA: Diagnosis not present

## 2022-07-30 DIAGNOSIS — J069 Acute upper respiratory infection, unspecified: Secondary | ICD-10-CM | POA: Diagnosis not present

## 2022-07-30 DIAGNOSIS — R059 Cough, unspecified: Secondary | ICD-10-CM | POA: Diagnosis present

## 2022-07-30 LAB — URINE DRUG PANEL 7
Amphetamines, Urine: NEGATIVE ng/mL
Barbiturate Quant, Ur: NEGATIVE ng/mL
Benzodiazepine Quant, Ur: NEGATIVE ng/mL
Cannabinoid Quant, Ur: NEGATIVE ng/mL
Cocaine (Metab.): NEGATIVE ng/mL
Opiate Quant, Ur: NEGATIVE ng/mL
PCP Quant, Ur: NEGATIVE ng/mL

## 2022-07-30 LAB — RESP PANEL BY RT-PCR (FLU A&B, COVID) ARPGX2
Influenza A by PCR: NEGATIVE
Influenza B by PCR: NEGATIVE
SARS Coronavirus 2 by RT PCR: NEGATIVE

## 2022-07-30 MED ORDER — PREDNISONE 20 MG PO TABS: 40.0000 mg | ORAL_TABLET | Freq: Every day | ORAL | 0 refills | Status: AC

## 2022-07-30 MED ORDER — CYCLOBENZAPRINE HCL 5 MG PO TABS
10.0000 mg | ORAL_TABLET | Freq: Once | ORAL | Status: AC
  Administered 2022-07-30: 10 mg via ORAL
  Filled 2022-07-30: qty 2

## 2022-07-30 MED ORDER — METHYLPREDNISOLONE SODIUM SUCC 125 MG IJ SOLR
125.0000 mg | Freq: Once | INTRAMUSCULAR | Status: AC
  Administered 2022-07-30: 125 mg via INTRAVENOUS
  Filled 2022-07-30: qty 2

## 2022-07-30 MED ORDER — BENZONATATE 100 MG PO CAPS: 100.0000 mg | ORAL_CAPSULE | Freq: Three times a day (TID) | ORAL | 0 refills | Status: DC

## 2022-07-30 MED ORDER — LACTATED RINGERS IV SOLN: INTRAVENOUS | Status: DC

## 2022-07-30 MED ORDER — DICYCLOMINE HCL 10 MG/5ML PO SOLN
10.0000 mg | Freq: Once | ORAL | Status: AC
  Administered 2022-07-30: 10 mg via ORAL
  Filled 2022-07-30: qty 5

## 2022-07-30 MED ORDER — ALUM & MAG HYDROXIDE-SIMETH 200-200-20 MG/5ML PO SUSP
30.0000 mL | Freq: Once | ORAL | Status: AC
  Administered 2022-07-30: 30 mL via ORAL
  Filled 2022-07-30: qty 30

## 2022-07-30 MED ORDER — IPRATROPIUM-ALBUTEROL 0.5-2.5 (3) MG/3ML IN SOLN
3.0000 mL | Freq: Once | RESPIRATORY_TRACT | Status: AC
Start: 2022-07-30 — End: 2022-07-30
  Administered 2022-07-30: 3 mL via RESPIRATORY_TRACT
  Filled 2022-07-30: qty 3

## 2022-07-30 MED ORDER — PROMETHAZINE-DM 6.25-15 MG/5ML PO SYRP: 2.5000 mL | ORAL_SOLUTION | Freq: Four times a day (QID) | ORAL | 0 refills | Status: DC | PRN

## 2022-07-30 NOTE — MAU Note (Signed)
RN called NICU RT to alert them of the Nebulizer treatment that is ordered. NICU RT stated they were in a delivery right now and they would be by after the delivery.

## 2022-07-30 NOTE — MAU Provider Note (Cosign Needed Addendum)
History     CSN: [id number]  Arrival date and time: 07/30/22 1832   Event Date/Time   First Provider Initiated Contact with Patient 07/30/22 1931      Chief Complaint  Patient presents with   Chest Pain   Back Pain   HPI Charlotte Cohen is a 21 y.o. year old G1P0 female at [redacted]w[redacted]d weeks gestation who presents to MAU reporting chest pain, coughing, upper abdominal pain, and lower back pain with coughing, yawning or sneezing. She has a h/o asthma and only can use a nebulizer. She last used her nebulizer at 0600 this morning. She reports the sx's started yesterday, but worsened today. She describes her upper abdominal pain as feeling "like being too full or bloated." She receives [facility] with [facility]; next appt is 08/26/2022. Her spouse is present and contributing to the history taking.     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   WISDOM TOOTH EXTRACTION  2021   four;    Family History  Problem Relation Age of Onset   Healthy Mother    Healthy Father    Healthy Sister    Healthy Sister    Healthy Brother    Healthy Brother    Healthy Brother    Eczema Brother    Cancer Maternal Grandmother        vaginal/uterine   Eczema Maternal Grandfather    Eczema Paternal Grandmother    Eczema Paternal Grandfather     Social History   Tobacco Use   Smoking status: Never   Smokeless tobacco: Never  Vaping Use   Vaping Use: Never used  Substance Use Topics   Alcohol use: Never   Drug use: Never    Allergies: No Known Allergies  Medications Prior to Admission  Medication Sig Dispense Refill Last Dose   cetirizine (ZYRTEC)  10 MG tablet Take 1 tablet (10 mg total) by mouth daily. 90 tablet 1 07/30/2022 at 1000   ondansetron (ZOFRAN-ODT) 4 MG disintegrating tablet Take 1 tablet (4 mg total) by mouth every 6 (six) hours as needed for nausea. 120 tablet 3 07/30/2022 at 1000   Prenatal Vit-Fe Fumarate-FA (MULTIVITAMIN-PRENATAL) 27-0.8 MG TABS tablet Take 1 tablet by mouth daily at 12 noon.   07/30/2022 at 1000   fluticasone (FLONASE) 50 MCG/ACT nasal spray Place 2 sprays into both nostrils daily. 16 g 2     Review of Systems  Constitutional: Negative.   HENT:  Negative for sore throat.   Eyes: Negative.   Respiratory:  Positive for cough, chest tightness, shortness of breath and wheezing.   Gastrointestinal:  Positive for abdominal pain (upper abdomen). Negative for nausea and vomiting.  Endocrine: Negative.   Genitourinary: Negative.   Musculoskeletal:  Positive for back pain.  Allergic/Immunologic: Negative.  Neurological: Negative.   Hematological: Negative.   Psychiatric/Behavioral: Negative.     Physical Exam   Blood pressure 122/68, pulse 82, temperature 98.4 F (36.9 C), temperature source Oral, resp. rate 19, height 5\' 3"  (1.6 m), weight 86.9 kg, last menstrual period 05/06/2022, SpO2 96 %.  Physical Exam Vitals and nursing note reviewed.  Constitutional:      Appearance: Normal appearance. She is obese.  Cardiovascular:     Rate and Rhythm: Normal rate.     Pulses: Normal pulses.     Heart sounds: Normal heart sounds.  Pulmonary:     Effort: Pulmonary effort is normal.     Breath sounds: Wheezing present.  Abdominal:     Palpations: Abdomen is soft.  Genitourinary:    Comments: deferred Musculoskeletal:        General: Normal range of motion.  Skin:    General: Skin is warm and dry.  Neurological:     Mental Status: She is alert and oriented to person, place, and time.  Psychiatric:        Mood and Affect: Mood normal.        Behavior: Behavior normal.        Thought Content:  Thought content normal.        Judgment: Judgment normal.     MAU Course  Procedures  MDM COVID/Flu swab Duoneb Tx Flexeril 10 mg po --     Report given to and care assumed by 05/08/2022, CNM @ [address] CNM 07/30/2022, 7:38 PM   Results for orders placed or performed during the hospital encounter of 07/30/22 (from the past 24 hour(s))  Resp Panel by RT-PCR (Flu A&B, Covid) Anterior Nasal Swab     Status: None   Collection Time: 07/30/22  8:11 PM   Specimen: Anterior Nasal Swab  Result Value Ref Range   SARS Coronavirus 2 by RT PCR NEGATIVE NEGATIVE   Influenza A by PCR NEGATIVE NEGATIVE   Influenza B by PCR NEGATIVE NEGATIVE    Patient reports no improvement of symptoms except wheezing. Discussed steroids for acute asthma exacerbation and patient agreeable to plan of care   LR infusion IV solumedrol- patient reports improvement  Discussed costochondritis and OTC methods to help. Discussed options for cough suppression and patient desires.   Assessment and Plan   1. Viral URI with cough   2. Mild intermittent asthma with acute exacerbation   3. [redacted] weeks gestation of pregnancy   4. Costochondritis, acute    -Discharge home in stable condition -Rx for tessalon perles, phenergan cough syrup sent to pharmacy -URI precautions discussed -Patient advised to follow-up with OB as scheduled for prenatal care -Patient may return to MAU as needed or if her condition were to change or worsen  08/01/22, CNM 07/30/22 11:00 PM

## 2022-07-30 NOTE — Discharge Instructions (Signed)

## 2022-07-30 NOTE — MAU Note (Addendum)
Charlotte Cohen is a 20 y.o. at [redacted]w[redacted]d here in MAU reporting: she's having chest pain, upper abdominal and lower back pain when coughs, yawns or sneezes.  States has a Hx of asthma, used nebulizer and had temporary relief.  States can't utilize a inhaler.  Reports symptoms began yesterday and have worsened today.   Denies VB or pain in lower abdomen. LMP: N/A Onset of complaint: yesterday Pain score: 10 Vitals:   07/30/22 1852  BP: 132/78  Pulse: 83  Resp: 19  Temp: 98.4 F (36.9 C)  SpO2: 95%     FHT:N/A Lab orders placed from triage:   None

## 2022-07-31 ENCOUNTER — Encounter: Payer: Self-pay | Admitting: Family Medicine

## 2022-07-31 ENCOUNTER — Other Ambulatory Visit: Payer: Self-pay | Admitting: Family Medicine

## 2022-07-31 LAB — CERVICOVAGINAL ANCILLARY ONLY
Bacterial Vaginitis (gardnerella): NEGATIVE
Candida Glabrata: NEGATIVE
Candida Vaginitis: POSITIVE — AB
Chlamydia: NEGATIVE
Comment: NEGATIVE
Comment: NEGATIVE
Comment: NEGATIVE
Comment: NEGATIVE
Comment: NEGATIVE
Comment: NORMAL
Neisseria Gonorrhea: NEGATIVE
Trichomonas: NEGATIVE

## 2022-07-31 LAB — URINE CULTURE

## 2022-07-31 MED ORDER — MONTELUKAST SODIUM 10 MG PO TABS: 10.0000 mg | ORAL_TABLET | Freq: Every day | ORAL | 3 refills | Status: DC

## 2022-08-01 ENCOUNTER — Telehealth: Payer: Self-pay | Admitting: Licensed Practical Nurse

## 2022-08-01 NOTE — Telephone Encounter (Signed)
TC to [facility] Your swab shows that you have a yeast infection, this explains the symptoms you are having. Rec Monistat 7 cream, pt verbalizes understanding.  Loraine Grip was recently seen at the MAU for a asthma attack, reports she feels much better Questions answered regarding dying hair in pregnancy  Jannifer Hick   [facility]  08/01/2022 12:06 PM

## 2022-08-04 ENCOUNTER — Encounter: Payer: Self-pay | Admitting: Family Medicine

## 2022-08-11 LAB — CYTOLOGY - PAP
Comment: NEGATIVE
Comment: NEGATIVE
Comment: NEGATIVE
Diagnosis: UNDETERMINED — AB
HPV 16: NEGATIVE
HPV 18 / 45: NEGATIVE
High risk HPV: POSITIVE — AB

## 2022-08-26 ENCOUNTER — Ambulatory Visit (INDEPENDENT_AMBULATORY_CARE_PROVIDER_SITE_OTHER): Payer: Medicaid Other | Admitting: Advanced Practice Midwife

## 2022-08-26 ENCOUNTER — Encounter: Payer: Self-pay | Admitting: Advanced Practice Midwife

## 2022-08-26 VITALS — BP 114/55 | HR 89 | Ht 63.0 in | Wt 197.0 lb

## 2022-08-26 DIAGNOSIS — Z3A13 13 weeks gestation of pregnancy: Secondary | ICD-10-CM

## 2022-08-26 DIAGNOSIS — Z3402 Encounter for supervision of normal first pregnancy, second trimester: Secondary | ICD-10-CM

## 2022-08-26 LAB — POCT URINALYSIS DIPSTICK OB
Bilirubin, UA: NEGATIVE
Blood, UA: NEGATIVE
Glucose, UA: NEGATIVE
Ketones, UA: NEGATIVE
Leukocytes, UA: NEGATIVE
Nitrite, UA: NEGATIVE
POC,PROTEIN,UA: NEGATIVE
Spec Grav, UA: 1.01 (ref 1.010–1.025)
Urobilinogen, UA: 1 E.U./dL
pH, UA: 6 (ref 5.0–8.0)

## 2022-08-26 NOTE — Progress Notes (Signed)
Routine Prenatal Care Visit  Subjective  Charlotte Cohen is a 21 y.o. G1P0 at [redacted]w[redacted]d being seen today for ongoing prenatal care.  She is currently monitored for the following issues for this low-risk pregnancy and has Supervision of normal pregnancy on their problem list.  ----------------------------------------------------------------------------------- Patient reports lower right quadrant pain sometimes following intercourse. Advised abstain for now if she prefers. She still has some N/V. Zofran is helping some. She usually vomits due to heartburn. Comfort measures reviewed. Contractions: Not present. Vag. Bleeding: None.  Movement: Absent. Leaking Fluid denies.  ----------------------------------------------------------------------------------- The following portions of the patient's history were reviewed and updated as appropriate: allergies, current medications, past family history, past medical history, past social history, past surgical history and problem list. Problem list updated.  Objective  Blood pressure (!) 114/55, pulse 89, height 5\' 3"  (1.6 m), weight 197 lb (89.4 kg), last menstrual period 05/06/2022. Pregravid weight 185 lb (83.9 kg) Total Weight Gain 12 lb (5.443 kg) Urinalysis: Urine Protein    Urine Glucose    Fetal Status: Fetal Heart Rate (bpm): 152   Movement: Absent     General:  Alert, oriented and cooperative. Patient is in no acute distress.  Skin: Skin is warm and dry. No rash noted.   Cardiovascular: Normal heart rate noted  Respiratory: Normal respiratory effort, no problems with respiration noted  Abdomen: Soft, gravid, appropriate for gestational age. Pain/Pressure: Absent     Pelvic:  Cervical exam deferred        Extremities: Normal range of motion.  Edema: None  Mental Status: Normal mood and affect. Normal behavior. Normal judgment and thought content.   Assessment   21 y.o. G1P0 at [redacted]w[redacted]d by  02/27/2023, by Ultrasound presenting for routine prenatal  visit  Plan   first Problems (from 07/14/22 to present)     Problem Noted Resolved   Supervision of normal pregnancy 07/18/2022 by 13/11/2021, CMA No   Overview Addendum 08/26/2022  4:10 PM by 14/08/2022, CMA     Clinical Staff Provider  Office Location  [facility] Dating  02/10/2023, by Last Menstrual Period  Language  English Anatomy 02/12/2023    Flu Vaccine  declined Genetic Screen  NIPS:   TDaP vaccine   offer Hgb A1C or  GTT Early : Third trimester :   Covid No boosters   LAB RESULTS   Rhogam     Blood Type     Feeding Plan breast Antibody    Contraception IUD Rubella    Circumcision no RPR     Pediatrician  undecided HBsAg     Support Person Luis HIV    Prenatal Classes no Varicella     GBS  (For PCN allergy, check sensitivities)   BTL Consent  Hep C     VBAC Consent  Pap No results found for: "DIAGPAP"    Hgb Electro      CF      SMA                   Preterm labor symptoms and general obstetric precautions including but not limited to vaginal bleeding, contractions, leaking of fluid and fetal movement were reviewed in detail with the patient. Please refer to After Visit Summary for other counseling recommendations.   Return in about 4 weeks (around 09/23/2022) for rob.  11/22/2022, CNM 08/26/2022 4:39 PM

## 2022-09-15 NOTE — L&D Delivery Note (Signed)
Delivery Note Charlotte Cohen is a 22 y.o. G1P0 at [redacted]w[redacted]d admitted for PPROM.   GBS Status: NEGATIVE/-- (05/21 0247) Maximum Maternal Temperature: 98.1  Labor course: Initial SVE: 3.5/80/-2. Augmentation with: Pitocin. She then progressed to complete.  ROM: 22h 16m with clear fluid  Birth: At 2335 a viable female was delivered via spontaneous vaginal delivery (Presentation: ROA ). Nuchal cord present: No.  Shoulders and body delivered in usual fashion. Infant placed directly on mom's abdomen for bonding/skin-to-skin, baby dried and stimulated. Cord clamped x 2 after 1 minute and cut by FOB.  Cord blood collected.  The placenta separated spontaneously and delivered via gentle cord traction.  Pitocin infused rapidly IV per protocol.  Fundus firm with massage, then boggy. TXA and cytotec buccal/493mcg rectal given, LUS cleared of clots, fundus firms then boggy again, methergine given, LUS cleared of clots again. Fundus remained firm and no further clots.  Placenta inspected and appears to be intact with a 3 VC.  Placenta/Cord with the following complications: very large placenta .  Cord pH: not done Sponge and instrument count were correct x2.  Intrapartum complications:  PPROM Anesthesia:  epidural Episiotomy: none Lacerations:  Rt vaginal Suture Repair: 3.0 vicryl EBL (mL): 658.00     Infant: APGAR (1 MIN):  vigorous infant, see delivery summary for APGARS APGAR (5 MINS):   APGAR (10 MINS):    Infant weight: pending  Delivery Report: Review the Delivery Report for details.    Mom to postpartum.  Baby to Couplet care / Skin to Skin. Placenta to Pathology for PPROM    Plans to Breastfeed Contraception: undecided Circumcision: N/A  Note sent to:  N/A, got care at [facility]  Cheral Marker CNM, [facility] 02/04/2023 12:17 AM

## 2022-09-19 ENCOUNTER — Ambulatory Visit
Admission: EM | Admit: 2022-09-19 | Discharge: 2022-09-19 | Disposition: A | Discharge status: Home / Self Care | Payer: Medicaid Other | Attending: Physician Assistant | Admitting: Physician Assistant

## 2022-09-19 DIAGNOSIS — J029 Acute pharyngitis, unspecified: Secondary | ICD-10-CM | POA: Diagnosis not present

## 2022-09-19 DIAGNOSIS — J069 Acute upper respiratory infection, unspecified: Secondary | ICD-10-CM | POA: Insufficient documentation

## 2022-09-19 DIAGNOSIS — R062 Wheezing: Secondary | ICD-10-CM

## 2022-09-19 LAB — POCT RAPID STREP A (OFFICE): Rapid Strep A Screen: NEGATIVE

## 2022-09-19 MED ORDER — DM-GUAIFENESIN ER 30-600 MG PO TB12: 1.0000 | ORAL_TABLET | Freq: Two times a day (BID) | ORAL | 0 refills | Status: DC

## 2022-09-19 MED ORDER — ALBUTEROL SULFATE (2.5 MG/3ML) 0.083% IN NEBU: 2.5000 mg | INHALATION_SOLUTION | Freq: Four times a day (QID) | RESPIRATORY_TRACT | 12 refills | Status: DC | PRN

## 2022-09-19 NOTE — ED Provider Notes (Signed)
[facility]    CSN: [id number] Arrival date & time: 09/19/22  9833      History   Chief Complaint Chief Complaint  Patient presents with   Sore Throat   Cough   Nasal Congestion    HPI Charlotte Cohen is a 22 y.o. female.   22 year old female presents with sore throat, cough and congestion.  Patient indicates that she is [redacted] weeks pregnant.  She indicates for the past 2 days she has had some upper respiratory congestion with rhinitis, postnasal drip, sore throat and progressive pain with swallowing.  She indicates production has been thick and green.  She also indicates having mild chest congestion with intermittent cough, production has been discolored.  Patient indicates she does have a history of having asthma and over the past several days she has had increasing wheezing and intermittent shortness of breath with coughing episodes.  Patient indicates she does have a nebulizer unit at home and she is using her albuterol to help decrease the wheezing.  She indicates she does need a refill of the albuterol Nebules.  Patient denies having fever, chills, nausea or vomiting.  Around her spouse who has similar symptoms.   Sore Throat Associated symptoms include shortness of breath.  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 Authorizing Provider  albuterol (PROVENTIL) (2.5 MG/3ML) 0.083% nebulizer solution Take 3 mLs (2.5 mg total) by nebulization every 6 (six) hours as needed for wheezing or shortness of breath. 09/19/22  Yes Ellsworth Lennox, PA-C  dextromethorphan-guaiFENesin [facility] DM) 30-600 MG 12hr tablet Take 1 tablet by mouth 2 (two) times daily. 09/19/22  Yes Ellsworth Lennox, PA-C  benzonatate (TESSALON) 100 MG capsule Take 1 capsule (100 mg total) by mouth every 8 (eight) hours. Patient not taking: Reported on 09/19/2022 07/30/22   Rolm Bookbinder, CNM  cetirizine (ZYRTEC) 10 MG tablet Take 1 tablet (10 mg total) by mouth daily. 01/16/22   Georganna Skeans, MD  fluticasone [facility]) 50 MCG/ACT nasal spray Place 2 sprays into both nostrils daily. 05/15/21   Georganna Skeans, MD  montelukast (SINGULAIR) 10 MG tablet Take 1 tablet (10 mg total) by mouth at bedtime. 07/31/22   Georganna Skeans, MD  ondansetron (ZOFRAN-ODT) 4 MG disintegrating tablet Take 1 tablet (4 mg total) by mouth every 6 (six) hours as needed for nausea. 07/29/22   Dominic, Courtney Heys, CNM  Prenatal Vit-Fe Fumarate-FA (MULTIVITAMIN-PRENATAL) 27-0.8  MG TABS tablet Take 1 tablet by mouth daily at 12 noon.    [provider]  promethazine-dextromethorphan (PROMETHAZINE-DM) 6.25-15 MG/5ML syrup Take 2.5 mLs by mouth 4 (four) times daily as needed for cough. 07/30/22   Wende Mott, CNM    Family History Family History  Problem Relation Age of Onset   Healthy Mother    Healthy Father    Healthy Sister    Healthy Sister    Healthy Brother    Healthy Brother    Healthy Brother    Eczema Brother    Cancer Maternal Grandmother        vaginal/uterine   Eczema Maternal Grandfather    Eczema Paternal Grandmother    Eczema Paternal Grandfather     Social History Social History   Tobacco Use   Smoking status: Never   Smokeless tobacco: Never  Vaping Use   Vaping Use: Never used  Substance Use Topics   Alcohol  use: Never   Drug use: Never     Allergies   Patient has no known allergies.   Review of Systems Review of Systems  HENT:  Positive for postnasal drip, rhinorrhea and sore throat.   Respiratory:  Positive for cough, shortness of breath and wheezing.      Physical Exam Triage Vital Signs ED Triage Vitals  Enc Vitals Group     BP 09/19/22 0847 108/75     Pulse Rate 09/19/22 0847 (!) 107     Resp 09/19/22 0847 19     Temp 09/19/22 0847 98.5 F (36.9 C)     Temp Source 09/19/22 0847 Oral     SpO2 09/19/22 0847 98 %     Weight --      Height --      Head Circumference --      Peak Flow --      Pain Score 09/19/22 0846 10     Pain Loc --      Pain Edu? --      Excl. in [place]? --    No data found.  Updated Vital Signs BP 108/75   Pulse (!) 107   Temp 98.5 F (36.9 C) (Oral)   Resp 19   LMP 05/06/2022 (Exact Date)   SpO2 98%   Visual Acuity Right Eye Distance:   Left Eye Distance:   Bilateral Distance:    Right Eye Near:   Left Eye Near:    Bilateral Near:     Physical Exam Constitutional:      Appearance: She is well-developed.  HENT:     Right Ear: Ear canal normal. Tympanic membrane is injected.     Left Ear: Ear canal normal. Tympanic membrane is injected.     Mouth/Throat:     Mouth: Mucous membranes are moist.     Pharynx: Posterior oropharyngeal erythema present. No oropharyngeal exudate.  Cardiovascular:     Rate and Rhythm: Normal rate and regular rhythm.     Heart sounds: Normal heart sounds.  Pulmonary:     Effort: Pulmonary effort is normal.     Breath sounds: Normal breath sounds and air entry. No wheezing, rhonchi or rales.  Lymphadenopathy:     Cervical: No cervical adenopathy.  Neurological:     Mental Status: She is alert.      UC Treatments / Results  Labs (all labs ordered are listed, but only abnormal results are displayed) Labs Reviewed  CULTURE, GROUP A STREP [facility])  POCT RAPID STREP A (OFFICE)  EKG   Radiology No  results found.  Procedures Procedures (including critical care time)  Medications Ordered in UC Medications - No data to display  Initial Impression / Assessment and Plan / UC Course  I have reviewed the triage vital signs and the nursing notes.  Pertinent labs & imaging results that were available during my care of the patient were reviewed by me and considered in my medical decision making (see chart for details).    Plan: 1.  The acute upper respiratory tract infection be treated with the following: A.  Advised patient to continue using honey and warm tea to help control cough.  (Patient has been offered Mucinex DM every 12 hours for cough since she is in the second trimester of pregnancy) 2.  The sore throat will be treated with the following: A.  Tylenol or ibuprofen for throat pain and discomfort. B.  Throat culture is pending 3.  The wheezing will be treated with the following: A.  Albuterol solution 0.083% Nebules has been sent to the pharmacy as a renewal. 4.  Advised patient follow-up PCP or return to urgent care if symptoms fail to improve. Final Clinical Impressions(s) / UC Diagnoses   Final diagnoses:  Acute upper respiratory infection  Sore throat  Wheezing     Discharge Instructions      Advised to use the albuterol nebulizer treatments on a regular basis to help control the cough, wheezing, and shortness of breath.  Advised to use Tylenol or ibuprofen to help control fever, body discomforts.  Advised to continue to use honey and warm tea to help control cough, since you are in the second trimester pregnancy you can use Mucinex DM for cough if desired however it is always advised to try to only rely on natural products to control cough and congestion.  Advised to follow-up PCP or return to urgent care if symptoms fail to improve.     ED Prescriptions     Medication Sig Dispense Auth. Provider   albuterol (PROVENTIL) (2.5 MG/3ML) 0.083% nebulizer  solution Take 3 mLs (2.5 mg total) by nebulization every 6 (six) hours as needed for wheezing or shortness of breath. 75 mL Nyoka Lint, PA-C   dextromethorphan-guaiFENesin [facility] DM) 30-600 MG 12hr tablet Take 1 tablet by mouth 2 (two) times daily. 20 tablet Nyoka Lint, PA-C      PDMP not reviewed this encounter.   Nyoka Lint, PA-C 09/19/22 [phone]

## 2022-09-19 NOTE — Discharge Instructions (Addendum)
Advised to use the albuterol nebulizer treatments on a regular basis to help control the cough, wheezing, and shortness of breath.  Advised to use Tylenol or ibuprofen to help control fever, body discomforts.  Advised to continue to use honey and warm tea to help control cough, since you are in the second trimester pregnancy you can use Mucinex DM for cough if desired however it is always advised to try to only rely on natural products to control cough and congestion.  Advised to follow-up PCP or return to urgent care if symptoms fail to improve.

## 2022-09-19 NOTE — ED Triage Notes (Signed)
Pt presents to uc with co of cough congestion, ha, body aches. Pt is 17 weeks preg and has not been taking any otc medications just drinking honey and tea.

## 2022-09-22 LAB — CULTURE, GROUP A STREP (THRC)

## 2022-09-23 ENCOUNTER — Encounter: Payer: Medicaid Other | Admitting: Obstetrics and Gynecology

## 2022-09-23 ENCOUNTER — Telehealth: Payer: Self-pay | Admitting: Obstetrics and Gynecology

## 2022-09-23 NOTE — Telephone Encounter (Signed)
Reached out to pt to reschedule ROB appt that scheduled for 09/23/2022 at 3:15 with Dr. Amalia Hailey.  Was able to reschedule pt for 09/24/22 at 3:15 with Dr. Amalia Hailey.

## 2022-09-24 ENCOUNTER — Ambulatory Visit (INDEPENDENT_AMBULATORY_CARE_PROVIDER_SITE_OTHER): Payer: Medicaid Other | Admitting: Obstetrics and Gynecology

## 2022-09-24 ENCOUNTER — Other Ambulatory Visit (HOSPITAL_COMMUNITY)
Admission: RE | Admit: 2022-09-24 | Discharge: 2022-09-24 | Disposition: A | Discharge status: Home / Self Care | Payer: Medicaid Other | Source: Ambulatory Visit | Attending: Obstetrics and Gynecology | Admitting: Obstetrics and Gynecology

## 2022-09-24 ENCOUNTER — Encounter: Payer: Self-pay | Admitting: Obstetrics and Gynecology

## 2022-09-24 VITALS — BP 114/78 | HR 94 | Wt 198.3 lb

## 2022-09-24 DIAGNOSIS — N898 Other specified noninflammatory disorders of vagina: Secondary | ICD-10-CM

## 2022-09-24 DIAGNOSIS — Z1379 Encounter for other screening for genetic and chromosomal anomalies: Secondary | ICD-10-CM

## 2022-09-24 DIAGNOSIS — R102 Pelvic and perineal pain: Secondary | ICD-10-CM | POA: Diagnosis not present

## 2022-09-24 DIAGNOSIS — Z3480 Encounter for supervision of other normal pregnancy, unspecified trimester: Secondary | ICD-10-CM

## 2022-09-24 DIAGNOSIS — O26892 Other specified pregnancy related conditions, second trimester: Secondary | ICD-10-CM

## 2022-09-24 DIAGNOSIS — Z3689 Encounter for other specified antenatal screening: Secondary | ICD-10-CM

## 2022-09-24 DIAGNOSIS — Z3A17 17 weeks gestation of pregnancy: Secondary | ICD-10-CM

## 2022-09-24 LAB — POCT URINALYSIS DIPSTICK OB
Bilirubin, UA: NEGATIVE
Blood, UA: NEGATIVE
Glucose, UA: NEGATIVE
Ketones, UA: NEGATIVE
Leukocytes, UA: NEGATIVE
Nitrite, UA: NEGATIVE
POC,PROTEIN,UA: NEGATIVE
Spec Grav, UA: 1.015 (ref 1.010–1.025)
Urobilinogen, UA: 0.2 E.U./dL
pH, UA: 6.5 (ref 5.0–8.0)

## 2022-09-24 NOTE — Progress Notes (Signed)
ROB. Patient states feeling under the weather, has been having cold symptoms and a cough. She reports pelvic pain along with a greenish discharge and odor. AFP ordered. Anatomy ultrasound ordered. She states no questions or concerns at this time.

## 2022-09-24 NOTE — Progress Notes (Signed)
ROB: Patient has occasional headaches and lightheadedness.  We discussed increased hydration.  aFP today.  Schedule anatomy ultrasound for next visit.  Taking vitamins as directed.  Complains of a greenish vaginal discharge but otherwise asymptomatic.  Nuswab performed.

## 2022-09-26 ENCOUNTER — Other Ambulatory Visit: Payer: Self-pay

## 2022-09-26 DIAGNOSIS — B379 Candidiasis, unspecified: Secondary | ICD-10-CM

## 2022-09-26 LAB — AFP, SERUM, OPEN SPINA BIFIDA
AFP MoM: 1.27
AFP Value: 45.3 ng/mL
Gest. Age on Collection Date: 17.7 weeks
Maternal Age At EDD: 22.8 yr
OSBR Risk 1 IN: 5251
Test Results:: NEGATIVE
Weight: 198 [lb_av]

## 2022-09-26 LAB — CERVICOVAGINAL ANCILLARY ONLY
Bacterial Vaginitis (gardnerella): NEGATIVE
Candida Glabrata: NEGATIVE
Candida Vaginitis: POSITIVE — AB
Chlamydia: NEGATIVE
Comment: NEGATIVE
Comment: NEGATIVE
Comment: NEGATIVE
Comment: NEGATIVE
Comment: NEGATIVE
Comment: NORMAL
Neisseria Gonorrhea: NEGATIVE
Trichomonas: NEGATIVE

## 2022-09-27 NOTE — Progress Notes (Signed)
I didn't order this lab so making sure you see the results.

## 2022-09-29 ENCOUNTER — Other Ambulatory Visit: Payer: Self-pay

## 2022-09-29 DIAGNOSIS — B379 Candidiasis, unspecified: Secondary | ICD-10-CM

## 2022-09-29 MED ORDER — FLUCONAZOLE 150 MG PO TABS: 150.0000 mg | ORAL_TABLET | Freq: Every day | ORAL | 0 refills | Status: DC

## 2022-10-13 ENCOUNTER — Ambulatory Visit
Admission: RE | Admit: 2022-10-13 | Discharge: 2022-10-13 | Disposition: A | Discharge status: Home / Self Care | Payer: Medicaid Other | Source: Ambulatory Visit | Attending: Obstetrics and Gynecology | Admitting: Obstetrics and Gynecology

## 2022-10-13 DIAGNOSIS — O321XX0 Maternal care for breech presentation, not applicable or unspecified: Secondary | ICD-10-CM | POA: Diagnosis not present

## 2022-10-13 DIAGNOSIS — Z369 Encounter for antenatal screening, unspecified: Secondary | ICD-10-CM | POA: Diagnosis not present

## 2022-10-13 DIAGNOSIS — Z3480 Encounter for supervision of other normal pregnancy, unspecified trimester: Secondary | ICD-10-CM | POA: Insufficient documentation

## 2022-10-13 DIAGNOSIS — Z3689 Encounter for other specified antenatal screening: Secondary | ICD-10-CM | POA: Insufficient documentation

## 2022-10-13 DIAGNOSIS — Z3A21 21 weeks gestation of pregnancy: Secondary | ICD-10-CM | POA: Diagnosis not present

## 2022-10-22 ENCOUNTER — Ambulatory Visit (INDEPENDENT_AMBULATORY_CARE_PROVIDER_SITE_OTHER): Payer: Medicaid Other | Admitting: Licensed Practical Nurse

## 2022-10-22 ENCOUNTER — Encounter: Payer: Self-pay | Admitting: Licensed Practical Nurse

## 2022-10-22 VITALS — BP 110/74 | HR 91 | Wt 201.9 lb

## 2022-10-22 DIAGNOSIS — Z6835 Body mass index (BMI) 35.0-35.9, adult: Secondary | ICD-10-CM

## 2022-10-22 DIAGNOSIS — Z3482 Encounter for supervision of other normal pregnancy, second trimester: Secondary | ICD-10-CM

## 2022-10-22 DIAGNOSIS — Z3480 Encounter for supervision of other normal pregnancy, unspecified trimester: Secondary | ICD-10-CM

## 2022-10-22 DIAGNOSIS — Z3A21 21 weeks gestation of pregnancy: Secondary | ICD-10-CM

## 2022-10-22 LAB — POCT URINALYSIS DIPSTICK
Bilirubin, UA: NEGATIVE
Blood, UA: NEGATIVE
Glucose, UA: NEGATIVE
Ketones, UA: NEGATIVE
Leukocytes, UA: NEGATIVE
Nitrite, UA: NEGATIVE
Protein, UA: NEGATIVE
Spec Grav, UA: 1.01 (ref 1.010–1.025)
Urobilinogen, UA: 0.2 E.U./dL
pH, UA: 5 (ref 5.0–8.0)

## 2022-10-22 MED ORDER — ASPIRIN 81 MG PO CHEW: 81.0000 mg | CHEWABLE_TABLET | Freq: Every day | ORAL | Status: DC

## 2022-10-22 NOTE — Progress Notes (Signed)
Indications for ASA therapy (per uptodate) One of the following: Previous pregnancy with preeclampsia, especially early onset and with an adverse outcome No Multifetal gestation No Chronic hypertension No Type 1 or 2 diabetes mellitus No Chronic kidney disease No Autoimmune disease (antiphospholipid syndrome, systemic lupus erythematosus) No  Two or more of the following: Nulliparity Yes Obesity (body mass index >30 kg/m2) Yes Family history of preeclampsia in mother or sister No Age ?35 years No Sociodemographic characteristics (African American race, low socioeconomic level) No Personal risk factors (eg, previous pregnancy with low birth weight or small for gestational age infant, previous adverse pregnancy outcome [eg, stillbirth], interval >10 years between pregnancies) No   Reviewed ASA therapy with pt  Charlotte Cohen, [facility]  10/26/22  10:44 AM

## 2022-10-22 NOTE — Progress Notes (Signed)
Routine Prenatal Care Visit  Subjective  Charlotte Cohen is a 22 y.o. G1P0 at 1w5dbeing seen today for ongoing prenatal care.  She is currently monitored for the following issues for this low-risk pregnancy and has Supervision of normal pregnancy on their problem list.  ----------------------------------------------------------------------------------- Patient reports backache.  Here with her partner and his daughters.  Reviewed US-needs short interval fu -Has nasal congestion and a cough, her covid test was negative, comfort measures reviewed   Contractions: Not present. Vag. Bleeding: None.  Movement: Present. Leaking Fluid denies.  ----------------------------------------------------------------------------------- The following portions of the patient's history were reviewed and updated as appropriate: allergies, current medications, past family history, past medical history, past social history, past surgical history and problem list. Problem list updated.  Objective  Blood pressure 110/74, pulse 91, weight 201 lb 14.4 oz (91.6 kg), last menstrual period 05/06/2022. Pregravid weight 185 lb (83.9 kg) Total Weight Gain 16 lb 14.4 oz (7.666 kg) Urinalysis: Urine Protein    Urine Glucose    Fetal Status: Fetal Heart Rate (bpm): 140 Fundal Height: 25 cm Movement: Present     General:  Alert, oriented and cooperative. Patient is in no acute distress.  Skin: Skin is warm and dry. No rash noted.   Cardiovascular: Normal heart rate noted  Respiratory: Normal respiratory effort, no problems with respiration noted  Abdomen: Soft, gravid, appropriate for gestational age. Pain/Pressure: Present     Pelvic:  Cervical exam deferred        Extremities: Normal range of motion.  Edema: Trace  Mental Status: Normal mood and affect. Normal behavior. Normal judgment and thought content.   Assessment   22y.o. G1P0 at 254w5dy  02/27/2023, by Ultrasound presenting for routine prenatal visit  Plan    first Problems (from 07/14/22 to present)     Problem Noted Resolved   Supervision of normal pregnancy 07/18/2022 by JoCleophas DunkerCMTuntutuliako   Overview Addendum 08/26/2022  4:10 PM by PaQuintella BatonCMSmileytaff Provider  Office Location  [facility] Dating  02/10/2023, by Last Menstrual Period  Language  English Anatomy [place]  Flu Vaccine  declined Genetic Screen  NIPS:   TDaP vaccine   offer Hgb A1C or  GTT Early : Third trimester :   Covid No boosters   LAB RESULTS   Rhogam     Blood Type     Feeding Plan breast Antibody    Contraception IUD Rubella    Circumcision no RPR     Pediatrician  undecided HBsAg     Support Person Luis HIV    Prenatal Classes no Varicella     GBS  (For PCN allergy, check sensitivities)   BTL Consent  Hep C     VBAC Consent  Pap No results found for: "DIAGPAP"    Hgb Electro      CF      SMA                  general obstetric precautions including but not limited to vaginal bleeding, contractions, leaking of fluid and fetal movement were reviewed in detail with the patient. Please refer to After Visit Summary for other counseling recommendations.   RTC in 4 weeks   LyRoberto ScalesCNLake Millsroup  10/26/22  10:42 AM

## 2022-10-26 ENCOUNTER — Other Ambulatory Visit: Payer: Self-pay | Admitting: Family Medicine

## 2022-10-31 ENCOUNTER — Ambulatory Visit: Payer: Medicaid Other

## 2022-10-31 ENCOUNTER — Inpatient Hospital Stay (HOSPITAL_COMMUNITY)
Admission: AD | Admit: 2022-10-31 | Discharge: 2022-10-31 | Disposition: A | Discharge status: Home / Self Care | Payer: Medicaid Other | Attending: Obstetrics and Gynecology | Admitting: Obstetrics and Gynecology

## 2022-10-31 ENCOUNTER — Encounter (HOSPITAL_COMMUNITY): Payer: Self-pay | Admitting: Obstetrics and Gynecology

## 2022-10-31 DIAGNOSIS — O99612 Diseases of the digestive system complicating pregnancy, second trimester: Secondary | ICD-10-CM | POA: Diagnosis not present

## 2022-10-31 DIAGNOSIS — Z3A23 23 weeks gestation of pregnancy: Secondary | ICD-10-CM | POA: Diagnosis not present

## 2022-10-31 DIAGNOSIS — K5900 Constipation, unspecified: Secondary | ICD-10-CM | POA: Diagnosis not present

## 2022-10-31 DIAGNOSIS — R102 Pelvic and perineal pain: Secondary | ICD-10-CM | POA: Diagnosis present

## 2022-10-31 DIAGNOSIS — O26892 Other specified pregnancy related conditions, second trimester: Secondary | ICD-10-CM | POA: Diagnosis present

## 2022-10-31 MED ORDER — DOCUSATE SODIUM 250 MG PO CAPS: 250.0000 mg | ORAL_CAPSULE | Freq: Two times a day (BID) | ORAL | 0 refills | Status: DC

## 2022-10-31 NOTE — MAU Provider Note (Signed)
History     CSN: [id number]  Arrival date and time: 10/31/22 [id number]   Event Date/Time   First Provider Initiated Contact with Patient 10/31/22 [phone]      Chief Complaint  Patient presents with   Pelvic Pain   HPI  Charlotte Cohen is a 22 y.o. G1P0 at 11w0dwho presents for evaluation of pelvic pressure. Patient reports she was cleaning inside of her vagina in the shower and felt like there was a bulge. She reports she now feels pelvic pressure. She denies any pain. She denies any vaginal bleeding, discharge, and leaking of fluid. Reports normal fetal movement. She does report some issue with constipation and has not had a bowel movement in 2 days.    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   WISDOM TOOTH EXTRACTION  2021   four;    Family History  Problem Relation Age of Onset   Healthy Mother    Healthy Father    Healthy Sister    Healthy Sister    Healthy Brother    Healthy Brother    Healthy Brother    Eczema Brother    Cancer Maternal Grandmother        vaginal/uterine   Eczema Maternal Grandfather    Eczema Paternal Grandmother    Eczema Paternal Grandfather     Social History   Tobacco Use   Smoking status: Never   Smokeless tobacco: Never  Vaping Use   Vaping Use: Never used  Substance Use Topics   Alcohol use: Never   Drug use: Never    Allergies: No Known Allergies  No medications prior to admission.    Review of Systems  Constitutional: Negative.  Negative for fatigue and fever.  HENT: Negative.    Respiratory: Negative.  Negative for shortness of breath.   Cardiovascular: Negative.   Negative for chest pain.  Gastrointestinal:  Positive for constipation. Negative for abdominal pain, diarrhea, nausea and vomiting.  Genitourinary:  Positive for pelvic pain. Negative for dysuria, vaginal bleeding and vaginal discharge.  Neurological: Negative.  Negative for dizziness and headaches.   Physical Exam   Blood pressure 127/76, pulse 98, temperature 98.7 F (37.1 C), temperature source Oral, resp. rate 17, height 5' 3"$  (1.6 m), weight 92.8 kg, last menstrual period 05/06/2022, SpO2 94 %.  Patient Vitals for the past 24 hrs:  BP Temp Temp src Pulse Resp SpO2 Height Weight  10/31/22 0933 127/76 -- -- 98 -- -- -- --  10/31/22 0910 118/62 -- -- 92 -- 94 % -- --  10/31/22 0830 120/65 98.7 F (37.1 C) Oral (!) 103 17 96 % 5' 3"$  (1.6 m) 92.8 kg    Physical Exam Vitals and nursing note reviewed.  Constitutional:      General: She is not  in acute distress.    Appearance: She is well-developed.  HENT:     Head: Normocephalic.  Eyes:     Pupils: Pupils are equal, round, and reactive to light.  Cardiovascular:     Rate and Rhythm: Normal rate and regular rhythm.     Heart sounds: Normal heart sounds.  Pulmonary:     Effort: Pulmonary effort is normal. No respiratory distress.     Breath sounds: Normal breath sounds.  Abdominal:     General: Bowel sounds are normal. There is no distension.     Palpations: Abdomen is soft.     Tenderness: There is no abdominal tenderness.  Skin:    General: Skin is warm and dry.  Neurological:     Mental Status: She is alert and oriented to person, place, and time.  Psychiatric:        Mood and Affect: Mood normal.        Behavior: Behavior normal.        Thought Content: Thought content normal.        Judgment: Judgment normal.     Fetal Tracing:  Baseline: 130 Variability: moderate  Accels: 15x15 Decels: none  Toco: none  Dilation: Closed Exam by:: Len Blalock, CNM   MAU Course  Procedures   MDM Prenatal records  from community office reviewed. Pregnancy complicated by migraines. Labs ordered and reviewed.   Cervix closed/thick  Significant stool burden palpated on exam.   Discussed recommendation for enema and patient states she is too afraid of something "going up her butthole" and declines. Constipation management at home reviewed at length.    Assessment and Plan   1. Constipation during pregnancy in second trimester   2. [redacted] weeks gestation of pregnancy     -Discharge home in stable condition -Rx for colace sent to patient's pharmacy -Constipation precautions discussed -Patient advised to follow-up with OB as scheduled for prenatal care -Patient may return to MAU as needed or if her condition were to change or worsen  Wende Mott, CNM 10/31/2022,

## 2022-10-31 NOTE — MAU Note (Signed)
Charlotte Cohen is a 22 y.o. at 3w0dhere in MAU reporting: yesterday when she was taking her shower, she was cleaning inside her vagina and felt a round squishy ball. Now she is feeling pressure. Painful pressure is constant. No bleeding or LOF.  Reports +FM.  Has been constipated. No urinary complaints  Onset of complaint: yesterday Pain score: 5 Vitals:   10/31/22 0830  BP: 120/65  Pulse: (!) 103  Resp: 17  Temp: 98.7 F (37.1 C)  SpO2: 96%     FHT:126 Lab orders placed from triage:

## 2022-10-31 NOTE — Discharge Instructions (Signed)

## 2022-11-05 ENCOUNTER — Ambulatory Visit
Admission: RE | Admit: 2022-11-05 | Discharge: 2022-11-05 | Disposition: A | Discharge status: Home / Self Care | Payer: Medicaid Other | Source: Ambulatory Visit | Attending: Licensed Practical Nurse | Admitting: Licensed Practical Nurse

## 2022-11-05 DIAGNOSIS — Z3A23 23 weeks gestation of pregnancy: Secondary | ICD-10-CM | POA: Insufficient documentation

## 2022-11-05 DIAGNOSIS — Z3480 Encounter for supervision of other normal pregnancy, unspecified trimester: Secondary | ICD-10-CM

## 2022-11-05 DIAGNOSIS — Z3492 Encounter for supervision of normal pregnancy, unspecified, second trimester: Secondary | ICD-10-CM | POA: Diagnosis not present

## 2022-11-05 DIAGNOSIS — Z3689 Encounter for other specified antenatal screening: Secondary | ICD-10-CM | POA: Insufficient documentation

## 2022-11-07 ENCOUNTER — Encounter: Payer: Self-pay | Admitting: Licensed Practical Nurse

## 2022-11-19 ENCOUNTER — Telehealth: Payer: Self-pay | Admitting: Obstetrics and Gynecology

## 2022-11-19 ENCOUNTER — Encounter: Payer: Medicaid Other | Admitting: Obstetrics and Gynecology

## 2022-11-19 NOTE — Telephone Encounter (Signed)
Reached out to pt to reschedule ROB appt that was scheduled for 11/19/22 with Dr. Marcelline Mates at 3:55. Was able to get the pt rescheduled for 12/02/22 at 10:35 with JEG.

## 2022-12-02 ENCOUNTER — Ambulatory Visit (INDEPENDENT_AMBULATORY_CARE_PROVIDER_SITE_OTHER): Payer: Medicaid Other | Admitting: Advanced Practice Midwife

## 2022-12-02 VITALS — BP 100/60 | Wt 206.0 lb

## 2022-12-02 DIAGNOSIS — Z3402 Encounter for supervision of normal first pregnancy, second trimester: Secondary | ICD-10-CM

## 2022-12-02 DIAGNOSIS — Z130 Encounter for screening for diseases of the blood and blood-forming organs and certain disorders involving the immune mechanism: Secondary | ICD-10-CM

## 2022-12-02 DIAGNOSIS — Z131 Encounter for screening for diabetes mellitus: Secondary | ICD-10-CM

## 2022-12-02 DIAGNOSIS — Z3A27 27 weeks gestation of pregnancy: Secondary | ICD-10-CM

## 2022-12-02 MED ORDER — ACCU-CHEK AVIVA PLUS VI STRP: ORAL_STRIP | 12 refills | Status: DC

## 2022-12-02 MED ORDER — PRENATAL 27-0.8 MG PO TABS: 1.0000 | ORAL_TABLET | Freq: Every day | ORAL | 5 refills | Status: DC

## 2022-12-02 MED ORDER — ACCU-CHEK SOFTCLIX LANCETS MISC: 12 refills | Status: DC

## 2022-12-02 MED ORDER — ACCU-CHEK AVIVA PLUS W/DEVICE KIT: PACK | 0 refills | Status: DC

## 2022-12-02 NOTE — Progress Notes (Signed)
Routine Prenatal Care Visit  Subjective  Charlotte Cohen is a 22 y.o. G1P0 at [redacted]w[redacted]d being seen today for ongoing prenatal care.  She is currently monitored for the following issues for this low-risk pregnancy and has Supervision of normal pregnancy and Asthma on their problem list.  ----------------------------------------------------------------------------------- Patient reports having some lower abdominal bilateral pain- likely round ligament. She has not had any BS testing during this pregnancy. Hgb A1C done today. Will order glucometer and supplies for 1 week of testing given gastric sleeve history. Instructions and log given.  Contractions: Irritability. Vag. Bleeding: None.  Movement: Present. Leaking Fluid denies.  ----------------------------------------------------------------------------------- The following portions of the patient's history were reviewed and updated as appropriate: allergies, current medications, past family history, past medical history, past social history, past surgical history and problem list. Problem list updated.  Objective  Blood pressure 100/60, weight 206 lb (93.4 kg), last menstrual period 05/06/2022. Pregravid weight 185 lb (83.9 kg) Total Weight Gain 21 lb (9.526 kg) Urinalysis: Urine Protein    Urine Glucose    Fetal Status:     Movement: Present     General:  Alert, oriented and cooperative. Patient is in no acute distress.  Skin: Skin is warm and dry. No rash noted.   Cardiovascular: Normal heart rate noted  Respiratory: Normal respiratory effort, no problems with respiration noted  Abdomen: Soft, gravid, appropriate for gestational age. Pain/Pressure: Present     Pelvic:  Cervical exam deferred        Extremities: Normal range of motion.     Mental Status: Normal mood and affect. Normal behavior. Normal judgment and thought content.   Assessment   22 y.o. G1P0 at [redacted]w[redacted]d by  02/27/2023, by Ultrasound presenting for routine prenatal  visit  Plan   first Problems (from 07/14/22 to present)     Problem Noted Resolved   Supervision of normal pregnancy 07/18/2022 by Cleophas Dunker, [place] No   Overview Addendum 10/26/2022 10:41 AM by Allen Derry, CNM     Clinical Staff Provider  Office Location  [facility] Dating  02/10/2023, by Last Menstrual Period  Language  English Anatomy US  Norm w/ incomplete views   Flu Vaccine  declined Genetic Screen  NIPS: neg, female   TDaP vaccine   offer Hgb A1C or  GTT Early : Third trimester :   Covid No boosters   LAB RESULTS   Rhogam  O/Positive/-- (11/06 1456)  Blood Type O/Positive/-- (11/06 1456)   Feeding Plan breast Antibody Negative (11/06 1456)  Contraception IUD Rubella 1.79 (11/06 1456)  Circumcision no RPR Non Reactive (11/06 1456)   Pediatrician  undecided HBsAg Negative (11/06 1456)   Support Person Luis HIV Non Reactive (11/06 1456)  Prenatal Classes no Varicella Non immune    GBS  (For PCN allergy, check sensitivities)   BTL Consent  Hep C Non Reactive (11/06 1456)   VBAC Consent  Pap Diagnosis  Date Value Ref Range Status  07/29/2022 (A)  Final   - Atypical squamous cells of undetermined significance (ASC-US)      Hgb Electro      CF      SMA                Hgb A1C, CBC today Glucometer supplies ordered   Preterm labor symptoms and general obstetric precautions including but not limited to vaginal bleeding, contractions, leaking of fluid and fetal movement were reviewed in detail with the patient. Please refer to After Visit Summary  for other counseling recommendations.   Return in about 2 weeks (around 12/16/2022) for rob.  Rod Can, CNM 12/02/2022 11:21 AM

## 2022-12-03 ENCOUNTER — Encounter: Payer: Self-pay | Admitting: Advanced Practice Midwife

## 2022-12-03 LAB — HGB A1C W/O EAG: Hgb A1c MFr Bld: 5.7 % — ABNORMAL HIGH (ref 4.8–5.6)

## 2022-12-04 ENCOUNTER — Telehealth: Payer: Self-pay

## 2022-12-04 LAB — CBC
Hematocrit: 35.3 % (ref 34.0–46.6)
Hemoglobin: 11.3 g/dL (ref 11.1–15.9)
MCH: 30.6 pg (ref 26.6–33.0)
MCHC: 32 g/dL (ref 31.5–35.7)
MCV: 96 fL (ref 79–97)
Platelets: 249 10*3/uL (ref 150–450)
RBC: 3.69 x10E6/uL — ABNORMAL LOW (ref 3.77–5.28)
RDW: 12.9 % (ref 11.7–15.4)
WBC: 11.7 10*3/uL — ABNORMAL HIGH (ref 3.4–10.8)

## 2022-12-04 LAB — SPECIMEN STATUS REPORT

## 2022-12-04 NOTE — Telephone Encounter (Signed)
Pt calling; was rx'd diabetic strips; was never told she would need to buy a machine; she is confused; was told to check her sugar for a week since she is not able to do the 1hr gtt.  [phone]

## 2022-12-08 ENCOUNTER — Telehealth: Payer: Self-pay

## 2022-12-08 ENCOUNTER — Other Ambulatory Visit: Payer: Self-pay

## 2022-12-11 ENCOUNTER — Encounter: Payer: Self-pay | Admitting: Licensed Practical Nurse

## 2022-12-12 ENCOUNTER — Ambulatory Visit
Admission: EM | Admit: 2022-12-12 | Discharge: 2022-12-12 | Disposition: A | Discharge status: Home / Self Care | Payer: Medicaid Other | Attending: Family Medicine | Admitting: Family Medicine

## 2022-12-12 DIAGNOSIS — Z1152 Encounter for screening for COVID-19: Secondary | ICD-10-CM | POA: Diagnosis not present

## 2022-12-12 DIAGNOSIS — J069 Acute upper respiratory infection, unspecified: Secondary | ICD-10-CM | POA: Diagnosis not present

## 2022-12-12 DIAGNOSIS — J4521 Mild intermittent asthma with (acute) exacerbation: Secondary | ICD-10-CM | POA: Diagnosis not present

## 2022-12-12 MED ORDER — PREDNISONE 20 MG PO TABS: 40.0000 mg | ORAL_TABLET | Freq: Every day | ORAL | 0 refills | Status: AC

## 2022-12-12 NOTE — ED Provider Notes (Addendum)
Charlotte Cohen    CSN: [id number] Arrival date & time: 12/12/22  1151      History   Chief Complaint Chief Complaint  Patient presents with   Asthma    HPI Charlotte Cohen is a 22 y.o. female.   HPI Here for wheezing and cough and congestion.  About 2 days ago her wheezing started getting worse; she also began having congestion and rhinorrhea.  She does have a history of asthma.  No fever with this illness.  No nausea or vomiting.  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 Authorizing Provider  predniSONE (DELTASONE) 20 MG tablet Take 2 tablets (40 mg total) by mouth daily with breakfast for 5 days. 12/12/22 12/17/22 Yes Barrett Henle, MD  Accu-Chek Softclix Lancets lancets Check blood sugar 4 times daily; fasting and 2 hours after each meal 12/02/22   Rod Can, CNM  albuterol (PROVENTIL) (2.5 MG/3ML) 0.083% nebulizer solution Take 3 mLs (2.5 mg total) by nebulization every 6 (six) hours as needed for wheezing or shortness of breath. 09/19/22   Nyoka Lint, PA-C  aspirin 81 MG chewable tablet Chew 1 tablet (81 mg total) by mouth daily. 10/22/22   Dominic, Nunzio Cobbs, CNM  Blood Glucose Monitoring Suppl (ACCU-CHEK AVIVA PLUS) w/Device KIT Check blood sugar 4 times daily; fasting and 2 hours after each meal 12/02/22    Rod Can, CNM  cetirizine (ZYRTEC) 10 MG tablet Take 1 tablet (10 mg total) by mouth daily. 01/16/22   Dorna Mai, MD  docusate sodium (COLACE) 250 MG capsule Take 1 capsule (250 mg total) by mouth 2 (two) times daily. 10/31/22   Wende Mott, CNM  fluticasone (FLONASE) 50 MCG/ACT nasal spray Place 2 sprays into both nostrils daily. 05/15/21   Dorna Mai, MD  glucose blood (ACCU-CHEK AVIVA PLUS) test strip Use as instructed 12/02/22   Rod Can, CNM  montelukast (SINGULAIR) 10 MG tablet Take 1 tablet (10 mg total) by mouth at bedtime. 07/31/22   Dorna Mai, MD  Prenatal Vit-Fe Fumarate-FA (MULTIVITAMIN-PRENATAL) 27-0.8 MG TABS tablet Take 1 tablet by mouth daily at 12 noon. 12/02/22   Rod Can, CNM  promethazine-dextromethorphan (PROMETHAZINE-DM) 6.25-15 MG/5ML syrup Take 2.5 mLs by mouth 4 (four) times daily as needed for cough. 07/30/22   Wende Mott, CNM    Family History Family History  Problem Relation Age of Onset   Healthy Mother    Healthy  Father    Healthy Sister    Healthy Sister    Healthy Brother    Healthy Brother    Healthy Brother    Eczema Brother    Cancer Maternal Grandmother        vaginal/uterine   Eczema Maternal Grandfather    Eczema Paternal Grandmother    Eczema Paternal Grandfather     Social History Social History   Tobacco Use   Smoking status: Never   Smokeless tobacco: Never  Vaping Use   Vaping Use: Never used  Substance Use Topics   Alcohol use: Never   Drug use: Never     Allergies   Patient has no known allergies.   Review of Systems Review of Systems   Physical Exam Triage Vital Signs ED Triage Vitals  Enc Vitals Group     BP 12/12/22 1207 94/68     Pulse Rate 12/12/22 1207 (!) 112     Resp 12/12/22 1207 18     Temp 12/12/22 1207 98.2 F (36.8 C)     Temp Source 12/12/22 1207 Oral     SpO2 12/12/22 1207 98 %     Weight --      Height --      Head Circumference --      Peak Flow --       Pain Score 12/12/22 1206 2     Pain Loc --      Pain Edu? --      Excl. in [place]? --    No data found.  Updated Vital Signs BP 94/68 (BP Location: Left Arm)   Pulse (!) 112   Temp 98.2 F (36.8 C) (Oral)   Resp 18   LMP 05/06/2022 (Exact Date)   SpO2 98%   Visual Acuity Right Eye Distance:   Left Eye Distance:   Bilateral Distance:    Right Eye Near:   Left Eye Near:    Bilateral Near:     Physical Exam Vitals reviewed.  Constitutional:      General: She is not in acute distress.    Appearance: She is not ill-appearing, toxic-appearing or diaphoretic.  HENT:     Right Ear: Tympanic membrane and ear canal normal.     Left Ear: Tympanic membrane and ear canal normal.     Nose: Congestion present.     Mouth/Throat:     Mouth: Mucous membranes are moist.     Pharynx: No oropharyngeal exudate or posterior oropharyngeal erythema.  Eyes:     Extraocular Movements: Extraocular movements intact.     Conjunctiva/sclera: Conjunctivae normal.     Pupils: Pupils are equal, round, and reactive to light.  Cardiovascular:     Rate and Rhythm: Regular rhythm. Tachycardia present.     Heart sounds: No murmur heard. Pulmonary:     Effort: No respiratory distress.     Breath sounds: No stridor. No wheezing, rhonchi or rales.     Comments: There is no wheezing on exam.  Air movement is good and lungs are clear.  Chest:     Chest wall: No tenderness.  Musculoskeletal:     Cervical back: Neck supple.  Lymphadenopathy:     Cervical: No cervical adenopathy.  Skin:    Capillary Refill: Capillary refill takes less than 2 seconds.     Coloration: Skin is not jaundiced or pale.  Neurological:     General: No focal deficit present.     Mental Status: She is alert and oriented to  person, place, and time.  Psychiatric:        Behavior: Behavior normal.      UC Treatments / Results  Labs (all labs ordered are listed, but only abnormal results are displayed) Labs Reviewed  SARS  CORONAVIRUS 2 (TAT 6-24 HRS)    EKG   Radiology No results found.  Procedures Procedures (including critical Cohen time)  Medications Ordered in UC Medications - No data to display  Initial Impression / Assessment and Plan / UC Course  I have reviewed the triage vital signs and the nursing notes.  Pertinent labs & imaging results that were available during my Cohen of the patient were reviewed by me and considered in my medical decision making (see chart for details).       Prednisone is sent in for asthma exacerbation.  She states she has sufficient albuterol and nebulizer and states she has been told not to use an albuterol inhaler She also states she has cough medicine at home that is okay in pregnancy  She is swabbed for COVID, and if positive she is a candidate for Paxlovid with her being pregnant.  Her last EGFR was greater than 60 in October 2023.     Final Clinical Impressions(s) / UC Diagnoses   Final diagnoses:  Mild intermittent asthma with acute exacerbation  Viral URI     Discharge Instructions      Take prednisone 20 mg--2 daily for 5 days  Continue using your albuterol in the nebulizer at every 4 hours as needed.   You have been swabbed for COVID, and the test will result in the next 24 hours. Our staff will call you if positive. If the COVID test is positive, you should quarantine until you are fever free for 24 hours and you are starting to feel better, and then take added precautions for the next 5 days, such as physical distancing/wearing a mask and good hand hygiene/washing.       ED Prescriptions     Medication Sig Dispense Auth. Provider   predniSONE (DELTASONE) 20 MG tablet Take 2 tablets (40 mg total) by mouth daily with breakfast for 5 days. 10 tablet Windy Carina Gwenlyn Perking, MD      PDMP not reviewed this encounter.   Barrett Henle, MD 12/12/22 1222    Barrett Henle, MD 12/12/22 [phone]

## 2022-12-12 NOTE — Discharge Instructions (Addendum)
Take prednisone 20 mg--2 daily for 5 days  Continue using your albuterol in the nebulizer at every 4 hours as needed.   You have been swabbed for COVID, and the test will result in the next 24 hours. Our staff will call you if positive. If the COVID test is positive, you should quarantine until you are fever free for 24 hours and you are starting to feel better, and then take added precautions for the next 5 days, such as physical distancing/wearing a mask and good hand hygiene/washing.

## 2022-12-12 NOTE — ED Triage Notes (Signed)
Pt presents with wheezing and shortness of breath that has been unrelieved with nebulizer; pt states she has a non productive cough that is causing rib and flank pain.

## 2022-12-13 LAB — SARS CORONAVIRUS 2 (TAT 6-24 HRS): SARS Coronavirus 2: NEGATIVE

## 2022-12-16 NOTE — Telephone Encounter (Signed)
Glucose Monitoring kit was ordered on 12/02/22 by Rod Can, CNM.

## 2022-12-16 NOTE — Telephone Encounter (Signed)
Left voicemail inquiring if she has picked up her glucose monitor. Advised to contact us if she has not.

## 2022-12-16 NOTE — Telephone Encounter (Signed)
[place] as she received prior Auth for patient.

## 2022-12-16 NOTE — Telephone Encounter (Signed)
Pt calling; was told ins didn't cover glucose monitor; pt wants to know what's going on - is about 30weeks and was supposed to be tested at 28wks; maybe doing glucose test on a liquid? She knows she needs to be doing something about getting tested.  [phone]

## 2022-12-16 NOTE — Telephone Encounter (Signed)
This encounter was created in error - please disregard.

## 2022-12-18 ENCOUNTER — Other Ambulatory Visit: Payer: Self-pay

## 2022-12-18 DIAGNOSIS — Z131 Encounter for screening for diabetes mellitus: Secondary | ICD-10-CM

## 2022-12-18 MED ORDER — ACCU-CHEK GUIDE W/DEVICE KIT
PACK | 0 refills | Status: DC
Start: 2022-12-18 — End: 2023-02-05

## 2022-12-19 ENCOUNTER — Other Ambulatory Visit (HOSPITAL_COMMUNITY)
Admission: RE | Admit: 2022-12-19 | Discharge: 2022-12-19 | Disposition: A | Discharge status: Home / Self Care | Payer: Medicaid Other | Source: Ambulatory Visit | Attending: Medical | Admitting: Medical

## 2022-12-19 ENCOUNTER — Encounter: Payer: Self-pay | Admitting: Medical

## 2022-12-19 ENCOUNTER — Ambulatory Visit (INDEPENDENT_AMBULATORY_CARE_PROVIDER_SITE_OTHER): Payer: Medicaid Other | Admitting: Medical

## 2022-12-19 ENCOUNTER — Other Ambulatory Visit: Payer: Medicaid Other

## 2022-12-19 VITALS — BP 116/74 | HR 108 | Wt 211.2 lb

## 2022-12-19 DIAGNOSIS — Z3402 Encounter for supervision of normal first pregnancy, second trimester: Secondary | ICD-10-CM

## 2022-12-19 DIAGNOSIS — Z131 Encounter for screening for diabetes mellitus: Secondary | ICD-10-CM

## 2022-12-19 DIAGNOSIS — B3731 Acute candidiasis of vulva and vagina: Secondary | ICD-10-CM

## 2022-12-19 DIAGNOSIS — O98813 Other maternal infectious and parasitic diseases complicating pregnancy, third trimester: Secondary | ICD-10-CM

## 2022-12-19 DIAGNOSIS — Z3A27 27 weeks gestation of pregnancy: Secondary | ICD-10-CM

## 2022-12-19 DIAGNOSIS — O99843 Bariatric surgery status complicating pregnancy, third trimester: Secondary | ICD-10-CM

## 2022-12-19 DIAGNOSIS — R87810 Cervical high risk human papillomavirus (HPV) DNA test positive: Secondary | ICD-10-CM

## 2022-12-19 DIAGNOSIS — O3443 Maternal care for other abnormalities of cervix, third trimester: Secondary | ICD-10-CM

## 2022-12-19 DIAGNOSIS — R87610 Atypical squamous cells of undetermined significance on cytologic smear of cervix (ASC-US): Secondary | ICD-10-CM | POA: Insufficient documentation

## 2022-12-19 DIAGNOSIS — Z3A30 30 weeks gestation of pregnancy: Secondary | ICD-10-CM

## 2022-12-19 DIAGNOSIS — O24419 Gestational diabetes mellitus in pregnancy, unspecified control: Secondary | ICD-10-CM

## 2022-12-19 DIAGNOSIS — J4520 Mild intermittent asthma, uncomplicated: Secondary | ICD-10-CM

## 2022-12-19 DIAGNOSIS — O99513 Diseases of the respiratory system complicating pregnancy, third trimester: Secondary | ICD-10-CM

## 2022-12-19 NOTE — Telephone Encounter (Signed)
The patient is coming in today. We will bring this information to the patient and order for same day lab appointment.

## 2022-12-19 NOTE — Progress Notes (Signed)
   PRENATAL VISIT NOTE  Subjective:  Charlotte Cohen is a 22 y.o. G1P0 at [redacted]w[redacted]d being seen today for ongoing prenatal care.  She is currently monitored for the following issues for this high-risk pregnancy and has Supervision of normal pregnancy; Asthma; and ASCUS with positive high risk HPV cervical on their problem list.  Patient reports  abdominal pain .  Contractions: Not present. Vag. Bleeding: None.  Movement: Present. Denies leaking of fluid.   The following portions of the patient's history were reviewed and updated as appropriate: allergies, current medications, past family history, past medical history, past social history, past surgical history and problem list.   Objective:   Vitals:   12/19/22 0919  BP: 116/74  Pulse: (!) 108  Weight: 211 lb 3.2 oz (95.8 kg)    Fetal Status: Fetal Heart Rate (bpm): 130 Fundal Height: 31 cm Movement: Present     General:  Alert, oriented and cooperative. Patient is in no acute distress.  Skin: Skin is warm and dry. No rash noted.   Cardiovascular: Normal heart rate noted  Respiratory: Normal respiratory effort, no problems with respiration noted  Abdomen: Soft, gravid, appropriate for gestational age.  Pain/Pressure: Present     Pelvic: Cervical exam deferred        Extremities: Normal range of motion.  Edema: None  Mental Status: Normal mood and affect. Normal behavior. Normal judgment and thought content.   Assessment and Plan:  Pregnancy: G1P0 at [redacted]w[redacted]d 1. 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 Patient states that she can tolerate sugar without GI upset. Ordered GTT today, however patient drank Hi-C this am (large) will return Monday for lab only visit  - Complaint of vaginal swelling - self-swab obtained  - HA recently, normal BP. Likely tension HA due  to accompanied back and neck pain. Discussed belly band, hydrotherapy, heat for back and neck and Tylenol PRN   2. Mild intermittent asthma without complication - No recent inhaler use   3. ASCUS with positive high risk HPV cervical - Repeat in 1 year   4. [redacted] weeks gestation of pregnancy   Preterm labor symptoms and general obstetric precautions including but not limited to vaginal bleeding, contractions, leaking of fluid and fetal movement were reviewed in detail with the patient. Please refer to After Visit Summary for other counseling recommendations.   Return in about 2 weeks (around 01/02/2023) for LOB, In-Person - schedule with MD .  Future Appointments  Date Time Provider Department Center  12/23/2022  9:40 AM [facility] None  12/31/2022  2:35 PM Mirna Mires, CNM [facility] None  01/14/2023  2:00 PM Linzie Collin, MD [facility] None  01/21/2023  2:00 PM Linzie Collin, MD [facility] None    Vonzella Nipple, PA-C

## 2022-12-19 NOTE — Progress Notes (Signed)
ROB [redacted]w[redacted]d: Patient is having some upper abdominal pressure when she moves around. She has some vaginal swelling and irritation. She reports good movement.

## 2022-12-22 LAB — CERVICOVAGINAL ANCILLARY ONLY
Bacterial Vaginitis (gardnerella): NEGATIVE
Candida Glabrata: NEGATIVE
Candida Vaginitis: POSITIVE — AB
Chlamydia: NEGATIVE
Comment: NEGATIVE
Comment: NEGATIVE
Comment: NEGATIVE
Comment: NEGATIVE
Comment: NEGATIVE
Comment: NORMAL
Neisseria Gonorrhea: NEGATIVE
Trichomonas: NEGATIVE

## 2022-12-23 ENCOUNTER — Other Ambulatory Visit: Payer: Medicaid Other

## 2022-12-23 DIAGNOSIS — Z3402 Encounter for supervision of normal first pregnancy, second trimester: Secondary | ICD-10-CM | POA: Diagnosis not present

## 2022-12-23 MED ORDER — TERCONAZOLE 0.4 % VA CREA
1.0000 | TOPICAL_CREAM | Freq: Every day | VAGINAL | 0 refills | Status: DC
Start: 2022-12-23 — End: 2023-02-05

## 2022-12-23 NOTE — Addendum Note (Signed)
Addended by: Mckell Riecke N on: 12/23/2022 09:46 AM   Modules accepted: Orders  

## 2022-12-24 ENCOUNTER — Encounter: Payer: Self-pay | Admitting: Medical

## 2022-12-24 DIAGNOSIS — O24419 Gestational diabetes mellitus in pregnancy, unspecified control: Secondary | ICD-10-CM | POA: Insufficient documentation

## 2022-12-24 LAB — 28 WEEK RH+PANEL
Basophils Absolute: 0.1 10*3/uL (ref 0.0–0.2)
Basos: 1 %
EOS (ABSOLUTE): 0.3 10*3/uL (ref 0.0–0.4)
Eos: 3 %
Gestational Diabetes Screen: 163 mg/dL — ABNORMAL HIGH (ref 70–139)
HIV Screen 4th Generation wRfx: NONREACTIVE
Hematocrit: 31.6 % — ABNORMAL LOW (ref 34.0–46.6)
Hemoglobin: 10.3 g/dL — ABNORMAL LOW (ref 11.1–15.9)
Immature Grans (Abs): 0.1 10*3/uL (ref 0.0–0.1)
Immature Granulocytes: 1 %
Lymphocytes Absolute: 1.8 10*3/uL (ref 0.7–3.1)
Lymphs: 17 %
MCH: 29.9 pg (ref 26.6–33.0)
MCHC: 32.6 g/dL (ref 31.5–35.7)
MCV: 92 fL (ref 79–97)
Monocytes Absolute: 0.6 10*3/uL (ref 0.1–0.9)
Monocytes: 5 %
Neutrophils Absolute: 8.1 10*3/uL — ABNORMAL HIGH (ref 1.4–7.0)
Neutrophils: 73 %
Platelets: 212 10*3/uL (ref 150–450)
RBC: 3.45 x10E6/uL — ABNORMAL LOW (ref 3.77–5.28)
RDW: 13.1 % (ref 11.7–15.4)
RPR Ser Ql: NONREACTIVE
WBC: 11 10*3/uL — ABNORMAL HIGH (ref 3.4–10.8)

## 2022-12-24 MED ORDER — BLOOD GLUCOSE MONITORING SUPPL DEVI
1.0000 | Freq: Three times a day (TID) | 0 refills | Status: DC
Start: 2022-12-24 — End: 2023-01-15

## 2022-12-24 MED ORDER — LANCETS MISC. MISC: 1.0000 | Freq: Three times a day (TID) | 0 refills | Status: AC

## 2022-12-24 NOTE — Addendum Note (Signed)
Addended by: Tommie Raymond on: 12/24/2022 04:53 PM   Modules accepted: Orders

## 2022-12-25 ENCOUNTER — Telehealth: Payer: Self-pay

## 2022-12-25 DIAGNOSIS — O24419 Gestational diabetes mellitus in pregnancy, unspecified control: Secondary | ICD-10-CM

## 2022-12-25 NOTE — Telephone Encounter (Signed)
Patient called about her 2 hour GTT  her supplies are at her pharmacy, she asked what if its not covered my insurance I advised her to go try to pick it up and if it's not covered by insurance to give [place] a call. I also sent education information

## 2022-12-26 ENCOUNTER — Telehealth: Payer: Self-pay

## 2022-12-26 DIAGNOSIS — K912 Postsurgical malabsorption, not elsewhere classified: Secondary | ICD-10-CM | POA: Diagnosis not present

## 2022-12-26 DIAGNOSIS — E559 Vitamin D deficiency, unspecified: Secondary | ICD-10-CM | POA: Diagnosis not present

## 2022-12-26 DIAGNOSIS — Z903 Acquired absence of stomach [part of]: Secondary | ICD-10-CM | POA: Diagnosis not present

## 2022-12-26 NOTE — Telephone Encounter (Signed)
Pt calling to see if her records have been faxed to her new doctor.  Please call  [phone]

## 2022-12-31 ENCOUNTER — Ambulatory Visit (INDEPENDENT_AMBULATORY_CARE_PROVIDER_SITE_OTHER): Payer: Medicaid Other | Admitting: Obstetrics

## 2022-12-31 VITALS — BP 109/66 | HR 115 | Wt 208.1 lb

## 2022-12-31 DIAGNOSIS — Z3A31 31 weeks gestation of pregnancy: Secondary | ICD-10-CM

## 2022-12-31 DIAGNOSIS — O24419 Gestational diabetes mellitus in pregnancy, unspecified control: Secondary | ICD-10-CM

## 2022-12-31 DIAGNOSIS — Z3402 Encounter for supervision of normal first pregnancy, second trimester: Secondary | ICD-10-CM

## 2022-12-31 LAB — POCT URINALYSIS DIPSTICK OB
Bilirubin, UA: NEGATIVE
Blood, UA: NEGATIVE
Glucose, UA: NEGATIVE
Ketones, UA: NEGATIVE
Leukocytes, UA: NEGATIVE
Nitrite, UA: NEGATIVE
POC,PROTEIN,UA: NEGATIVE
Spec Grav, UA: 1.01 (ref 1.010–1.025)
Urobilinogen, UA: 0.2 E.U./dL
pH, UA: 6.5 (ref 5.0–8.0)

## 2022-12-31 NOTE — Progress Notes (Signed)
Routine Prenatal Care Visit  Subjective  Charlotte Cohen is a 22 y.o. G1P0 at [redacted]w[redacted]d being seen today for ongoing prenatal care.  She is currently monitored for the following issues for this high-risk pregnancy and has Supervision of normal pregnancy; Asthma; ASCUS with positive high risk HPV cervical; and Gestational diabetes mellitus (GDM) affecting first pregnancy on their problem list.  ----------------------------------------------------------------------------------- Patient reports no complaints.  She is GDM, and has not been checking her glucose levels per guidelines. Reviewed when to check her levels and what are elevated glucose readings Contractions: Irritability. Vag. Bleeding: None.  Movement: Present. Leaking Fluid denies.  ----------------------------------------------------------------------------------- The following portions of the patient's history were reviewed and updated as appropriate: allergies, current medications, past family history, past medical history, past social history, past surgical history and problem list. Problem list updated.  Objective  Blood pressure 109/66, pulse (!) 115, weight 208 lb 1.6 oz (94.4 kg), last menstrual period 05/06/2022. Pregravid weight 185 lb (83.9 kg) Total Weight Gain 23 lb 1.6 oz (10.5 kg) Urinalysis: Urine Protein Negative  Urine Glucose Negative  Fetal Status: Fetal Heart Rate (bpm): 145 Fundal Height: 34 cm Movement: Present     General:  Alert, oriented and cooperative. Patient is in no acute distress.  Skin: Skin is warm and dry. No rash noted.   Cardiovascular: Normal heart rate noted  Respiratory: Normal respiratory effort, no problems with respiration noted  Abdomen: Soft, gravid, appropriate for gestational age. Pain/Pressure: Present     Pelvic:  Cervical exam deferred        Extremities: Normal range of motion.  Edema: Trace  Mental Status: Normal mood and affect. Normal behavior. Normal judgment and thought  content.   Assessment   22 y.o. G1P0 at [redacted]w[redacted]d by  02/27/2023, by Ultrasound presenting for routine prenatal visit  Plan   first Problems (from 07/14/22 to present)     Problem Noted Resolved   Supervision of normal pregnancy 07/18/2022 by Loran Senters, CMA No   Overview Addendum 12/19/2022  9:33 AM by Marny Lowenstein, PA-C     Clinical Staff Provider  Office Location  [facility] Dating  02/10/2023, by Last Menstrual Period  Language  English Anatomy US  Norm w/ incomplete views   Flu Vaccine  declined Genetic Screen  NIPS: neg, female   TDaP vaccine   offer Hgb A1C or  GTT Early : Third trimester :   Covid No boosters   LAB RESULTS   Rhogam  O/Positive/-- (11/06 1456)  Blood Type O/Positive/-- (11/06 1456)   Feeding Plan breast Antibody Negative (11/06 1456)  Contraception Unsure Rubella 1.79 (11/06 1456)  Circumcision no RPR Non Reactive (11/06 1456)   Pediatrician  undecided HBsAg Negative (11/06 1456)   Support Person Luis HIV Non Reactive (11/06 1456)  Prenatal Classes no Varicella Non immune    GBS  (For PCN allergy, check sensitivities)   BTL Consent NA Hep C Non Reactive (11/06 1456)   VBAC Consent NA Pap Diagnosis  Date Value Ref Range Status  07/29/2022 (A)  Final   - Atypical squamous cells of undetermined significance (ASC-US)      Hgb Electro      CF      SMA                   Preterm labor symptoms and general obstetric precautions including but not limited to vaginal bleeding, contractions, leaking of fluid and fetal movement were reviewed in detail with the patient. Please  refer to After Visit Summary for other counseling recommendations.   Return in about 2 weeks (around 01/14/2023) for return OB wtih MD please. Higher risk, GDM. review growth scan.  Mirna Mires, CNM  12/31/2022 5:24 PM

## 2023-01-07 ENCOUNTER — Ambulatory Visit
Admission: RE | Admit: 2023-01-07 | Discharge: 2023-01-07 | Disposition: A | Discharge status: Home / Self Care | Payer: Medicaid Other | Source: Ambulatory Visit | Attending: Obstetrics | Admitting: Obstetrics

## 2023-01-07 DIAGNOSIS — Z3402 Encounter for supervision of normal first pregnancy, second trimester: Secondary | ICD-10-CM | POA: Insufficient documentation

## 2023-01-07 DIAGNOSIS — Z3689 Encounter for other specified antenatal screening: Secondary | ICD-10-CM | POA: Diagnosis not present

## 2023-01-07 DIAGNOSIS — Z3A34 34 weeks gestation of pregnancy: Secondary | ICD-10-CM | POA: Insufficient documentation

## 2023-01-07 DIAGNOSIS — O24419 Gestational diabetes mellitus in pregnancy, unspecified control: Secondary | ICD-10-CM | POA: Diagnosis not present

## 2023-01-14 ENCOUNTER — Encounter: Payer: Medicaid Other | Admitting: Obstetrics and Gynecology

## 2023-01-14 DIAGNOSIS — Z3403 Encounter for supervision of normal first pregnancy, third trimester: Secondary | ICD-10-CM

## 2023-01-14 DIAGNOSIS — O24419 Gestational diabetes mellitus in pregnancy, unspecified control: Secondary | ICD-10-CM

## 2023-01-14 DIAGNOSIS — Z3A33 33 weeks gestation of pregnancy: Secondary | ICD-10-CM

## 2023-01-15 ENCOUNTER — Other Ambulatory Visit: Payer: Self-pay | Admitting: Family Medicine

## 2023-01-15 DIAGNOSIS — J309 Allergic rhinitis, unspecified: Secondary | ICD-10-CM

## 2023-01-15 MED ORDER — ACCU-CHEK GUIDE VI STRP
ORAL_STRIP | 12 refills | Status: DC
Start: 2023-01-15 — End: 2023-02-05

## 2023-01-15 NOTE — Telephone Encounter (Addendum)
Patient has transferred care to [facility]. Her first appointment isn't until 01/23/23. Spoke with pharmacy. Patient has 3 different Glucometer rx's sent (Accu-Chek Plus, Accu-Chek Guide, DEVI). Pharmacy advised patient has picked up Accu-Chek Guide and lancets. She still needs strips. Current rx is for Accu-check Plus Strips. Verbal given to change to Accu-Chek Guide Strips. Rx entered to update chart.

## 2023-01-15 NOTE — Addendum Note (Signed)
Addended by: Kathlene Cote on: 01/15/2023 08:30 AM   Modules accepted: Orders

## 2023-01-20 DIAGNOSIS — K912 Postsurgical malabsorption, not elsewhere classified: Secondary | ICD-10-CM | POA: Diagnosis not present

## 2023-01-20 DIAGNOSIS — D508 Other iron deficiency anemias: Secondary | ICD-10-CM | POA: Insufficient documentation

## 2023-01-20 DIAGNOSIS — R790 Abnormal level of blood mineral: Secondary | ICD-10-CM | POA: Diagnosis not present

## 2023-01-20 DIAGNOSIS — Z903 Acquired absence of stomach [part of]: Secondary | ICD-10-CM | POA: Diagnosis not present

## 2023-01-21 ENCOUNTER — Encounter: Payer: Medicaid Other | Admitting: Obstetrics and Gynecology

## 2023-01-22 NOTE — Telephone Encounter (Signed)
Per [place] records mailed 12/26/22.

## 2023-01-23 DIAGNOSIS — O09899 Supervision of other high risk pregnancies, unspecified trimester: Secondary | ICD-10-CM | POA: Insufficient documentation

## 2023-01-23 DIAGNOSIS — O26843 Uterine size-date discrepancy, third trimester: Secondary | ICD-10-CM | POA: Insufficient documentation

## 2023-01-23 DIAGNOSIS — Z23 Encounter for immunization: Secondary | ICD-10-CM | POA: Diagnosis not present

## 2023-01-23 DIAGNOSIS — Z3403 Encounter for supervision of normal first pregnancy, third trimester: Secondary | ICD-10-CM | POA: Diagnosis not present

## 2023-01-29 DIAGNOSIS — O24419 Gestational diabetes mellitus in pregnancy, unspecified control: Secondary | ICD-10-CM | POA: Diagnosis not present

## 2023-01-29 DIAGNOSIS — O3663X0 Maternal care for excessive fetal growth, third trimester, not applicable or unspecified: Secondary | ICD-10-CM | POA: Diagnosis not present

## 2023-01-29 DIAGNOSIS — O26843 Uterine size-date discrepancy, third trimester: Secondary | ICD-10-CM | POA: Diagnosis not present

## 2023-01-29 DIAGNOSIS — O99513 Diseases of the respiratory system complicating pregnancy, third trimester: Secondary | ICD-10-CM | POA: Diagnosis not present

## 2023-01-29 DIAGNOSIS — O99013 Anemia complicating pregnancy, third trimester: Secondary | ICD-10-CM | POA: Diagnosis not present

## 2023-01-29 DIAGNOSIS — O24410 Gestational diabetes mellitus in pregnancy, diet controlled: Secondary | ICD-10-CM | POA: Diagnosis not present

## 2023-01-29 DIAGNOSIS — N838 Other noninflammatory disorders of ovary, fallopian tube and broad ligament: Secondary | ICD-10-CM | POA: Diagnosis not present

## 2023-01-29 DIAGNOSIS — D508 Other iron deficiency anemias: Secondary | ICD-10-CM | POA: Diagnosis not present

## 2023-01-29 DIAGNOSIS — Z3A35 35 weeks gestation of pregnancy: Secondary | ICD-10-CM | POA: Diagnosis not present

## 2023-01-29 DIAGNOSIS — O3413 Maternal care for benign tumor of corpus uteri, third trimester: Secondary | ICD-10-CM | POA: Diagnosis not present

## 2023-01-29 DIAGNOSIS — J45909 Unspecified asthma, uncomplicated: Secondary | ICD-10-CM | POA: Diagnosis not present

## 2023-02-03 ENCOUNTER — Inpatient Hospital Stay (HOSPITAL_COMMUNITY)
Admission: AD | Admit: 2023-02-03 | Discharge: 2023-02-05 | DRG: 806 | Disposition: A | Discharge status: Home / Self Care | Payer: Medicaid Other | Attending: Obstetrics and Gynecology | Admitting: Obstetrics and Gynecology

## 2023-02-03 ENCOUNTER — Encounter (HOSPITAL_COMMUNITY): Payer: Self-pay | Admitting: Obstetrics and Gynecology

## 2023-02-03 ENCOUNTER — Other Ambulatory Visit: Payer: Self-pay

## 2023-02-03 ENCOUNTER — Inpatient Hospital Stay (HOSPITAL_COMMUNITY): Payer: Medicaid Other | Admitting: Anesthesiology

## 2023-02-03 DIAGNOSIS — J45909 Unspecified asthma, uncomplicated: Secondary | ICD-10-CM | POA: Diagnosis not present

## 2023-02-03 DIAGNOSIS — O9952 Diseases of the respiratory system complicating childbirth: Secondary | ICD-10-CM | POA: Diagnosis not present

## 2023-02-03 DIAGNOSIS — O24420 Gestational diabetes mellitus in childbirth, diet controlled: Secondary | ICD-10-CM | POA: Diagnosis present

## 2023-02-03 DIAGNOSIS — Z3A36 36 weeks gestation of pregnancy: Secondary | ICD-10-CM | POA: Diagnosis not present

## 2023-02-03 DIAGNOSIS — O42013 Preterm premature rupture of membranes, onset of labor within 24 hours of rupture, third trimester: Secondary | ICD-10-CM | POA: Diagnosis not present

## 2023-02-03 DIAGNOSIS — O24429 Gestational diabetes mellitus in childbirth, unspecified control: Secondary | ICD-10-CM | POA: Diagnosis not present

## 2023-02-03 DIAGNOSIS — O42913 Preterm premature rupture of membranes, unspecified as to length of time between rupture and onset of labor, third trimester: Secondary | ICD-10-CM | POA: Diagnosis not present

## 2023-02-03 DIAGNOSIS — O24419 Gestational diabetes mellitus in pregnancy, unspecified control: Secondary | ICD-10-CM | POA: Diagnosis present

## 2023-02-03 DIAGNOSIS — Z5986 Financial insecurity: Secondary | ICD-10-CM | POA: Diagnosis not present

## 2023-02-03 DIAGNOSIS — Z7982 Long term (current) use of aspirin: Secondary | ICD-10-CM

## 2023-02-03 DIAGNOSIS — O42919 Preterm premature rupture of membranes, unspecified as to length of time between rupture and onset of labor, unspecified trimester: Secondary | ICD-10-CM | POA: Diagnosis present

## 2023-02-03 DIAGNOSIS — O99214 Obesity complicating childbirth: Secondary | ICD-10-CM | POA: Diagnosis present

## 2023-02-03 DIAGNOSIS — O43813 Placental infarction, third trimester: Secondary | ICD-10-CM | POA: Diagnosis not present

## 2023-02-03 DIAGNOSIS — Z3402 Encounter for supervision of normal first pregnancy, second trimester: Principal | ICD-10-CM

## 2023-02-03 LAB — CBC
HCT: 35 % — ABNORMAL LOW (ref 36.0–46.0)
Hemoglobin: 11.1 g/dL — ABNORMAL LOW (ref 12.0–15.0)
MCH: 28.6 pg (ref 26.0–34.0)
MCHC: 31.7 g/dL (ref 30.0–36.0)
MCV: 90.2 fL (ref 80.0–100.0)
Platelets: 209 10*3/uL (ref 150–400)
RBC: 3.88 MIL/uL (ref 3.87–5.11)
RDW: 14.2 % (ref 11.5–15.5)
WBC: 10.7 10*3/uL — ABNORMAL HIGH (ref 4.0–10.5)
nRBC: 1.1 % — ABNORMAL HIGH (ref 0.0–0.2)

## 2023-02-03 LAB — GROUP B STREP BY PCR: Group B strep by PCR: NEGATIVE

## 2023-02-03 LAB — GLUCOSE, CAPILLARY
Glucose-Capillary: 119 mg/dL — ABNORMAL HIGH (ref 70–99)
Glucose-Capillary: 80 mg/dL (ref 70–99)
Glucose-Capillary: 86 mg/dL (ref 70–99)
Glucose-Capillary: 73 mg/dL (ref 70–99)

## 2023-02-03 LAB — TYPE AND SCREEN
ABO/RH(D): O POS
Antibody Screen: NEGATIVE

## 2023-02-03 LAB — RPR: RPR Ser Ql: NONREACTIVE

## 2023-02-03 LAB — POCT FERN TEST: POCT Fern Test: POSITIVE

## 2023-02-03 MED ORDER — FENTANYL CITRATE (PF) 100 MCG/2ML IJ SOLN
100.0000 ug | INTRAMUSCULAR | Status: DC | PRN
  Administered 2023-02-03 (×2): 100 ug via INTRAVENOUS
  Filled 2023-02-03 (×2): qty 2

## 2023-02-03 MED ORDER — ONDANSETRON HCL 4 MG/2ML IJ SOLN
4.0000 mg | Freq: Four times a day (QID) | INTRAMUSCULAR | Status: DC | PRN
  Administered 2023-02-03: 4 mg via INTRAVENOUS
  Filled 2023-02-03: qty 2

## 2023-02-03 MED ORDER — ACETAMINOPHEN 325 MG PO TABS
650.0000 mg | ORAL_TABLET | ORAL | Status: DC | PRN
  Administered 2023-02-03: 650 mg via ORAL
  Filled 2023-02-03: qty 2

## 2023-02-03 MED ORDER — TRANEXAMIC ACID-NACL 1000-0.7 MG/100ML-% IV SOLN
1000.0000 mg | INTRAVENOUS | Status: AC
  Administered 2023-02-03: 1000 mg via INTRAVENOUS

## 2023-02-03 MED ORDER — LACTATED RINGERS IV SOLN: 500.0000 mL | INTRAVENOUS | Status: DC | PRN

## 2023-02-03 MED ORDER — PENICILLIN G POT IN DEXTROSE 60000 UNIT/ML IV SOLN
3.0000 10*6.[IU] | INTRAVENOUS | Status: DC
  Administered 2023-02-03 (×2): 3 10*6.[IU] via INTRAVENOUS
  Filled 2023-02-03 (×3): qty 50

## 2023-02-03 MED ORDER — PHENYLEPHRINE 80 MCG/ML (10ML) SYRINGE FOR IV PUSH (FOR BLOOD PRESSURE SUPPORT): 80.0000 ug | PREFILLED_SYRINGE | INTRAVENOUS | Status: DC | PRN

## 2023-02-03 MED ORDER — DIPHENHYDRAMINE HCL 50 MG/ML IJ SOLN: 12.5000 mg | INTRAMUSCULAR | Status: DC | PRN

## 2023-02-03 MED ORDER — OXYTOCIN-SODIUM CHLORIDE 30-0.9 UT/500ML-% IV SOLN
1.0000 m[IU]/min | INTRAVENOUS | Status: DC
  Administered 2023-02-03: 2 m[IU]/min via INTRAVENOUS

## 2023-02-03 MED ORDER — FENTANYL-BUPIVACAINE-NACL 0.5-0.125-0.9 MG/250ML-% EP SOLN
12.0000 mL/h | EPIDURAL | Status: DC | PRN
  Administered 2023-02-03: 12 mL/h via EPIDURAL
  Filled 2023-02-03: qty 250

## 2023-02-03 MED ORDER — OXYTOCIN BOLUS FROM INFUSION
333.0000 mL | Freq: Once | INTRAVENOUS | Status: AC
  Administered 2023-02-03: 333 mL via INTRAVENOUS

## 2023-02-03 MED ORDER — OXYTOCIN-SODIUM CHLORIDE 30-0.9 UT/500ML-% IV SOLN
2.5000 [IU]/h | INTRAVENOUS | Status: DC
  Filled 2023-02-03 (×2): qty 500

## 2023-02-03 MED ORDER — MISOPROSTOL 200 MCG PO TABS
400.0000 ug | ORAL_TABLET | Freq: Once | ORAL | Status: AC
  Administered 2023-02-03: 400 ug via RECTAL

## 2023-02-03 MED ORDER — TERBUTALINE SULFATE 1 MG/ML IJ SOLN: 0.2500 mg | Freq: Once | INTRAMUSCULAR | Status: DC | PRN

## 2023-02-03 MED ORDER — MISOPROSTOL 200 MCG PO TABS
400.0000 ug | ORAL_TABLET | Freq: Once | ORAL | Status: AC
  Administered 2023-02-03: 400 ug via BUCCAL

## 2023-02-03 MED ORDER — OXYCODONE-ACETAMINOPHEN 5-325 MG PO TABS
2.0000 | ORAL_TABLET | ORAL | Status: DC | PRN
  Administered 2023-02-04: 2 via ORAL
  Filled 2023-02-03: qty 2

## 2023-02-03 MED ORDER — SOD CITRATE-CITRIC ACID 500-334 MG/5ML PO SOLN: 30.0000 mL | ORAL | Status: DC | PRN

## 2023-02-03 MED ORDER — EPHEDRINE 5 MG/ML INJ: 10.0000 mg | INTRAVENOUS | Status: DC | PRN

## 2023-02-03 MED ORDER — LACTATED RINGERS IV SOLN: 500.0000 mL | Freq: Once | INTRAVENOUS | Status: DC

## 2023-02-03 MED ORDER — SODIUM CHLORIDE 0.9 % IV SOLN
5.0000 10*6.[IU] | Freq: Once | INTRAVENOUS | Status: AC
  Administered 2023-02-03: 5 10*6.[IU] via INTRAVENOUS
  Filled 2023-02-03: qty 5

## 2023-02-03 MED ORDER — LIDOCAINE HCL (PF) 1 % IJ SOLN
INTRAMUSCULAR | Status: DC | PRN
  Administered 2023-02-03 (×2): 5 mL via EPIDURAL

## 2023-02-03 MED ORDER — LIDOCAINE HCL URETHRAL/MUCOSAL 2 % EX GEL
Freq: Once | CUTANEOUS | Status: AC
  Administered 2023-02-03: 6 via TOPICAL
  Filled 2023-02-03: qty 6

## 2023-02-03 MED ORDER — LIDOCAINE HCL (PF) 1 % IJ SOLN: 30.0000 mL | INTRAMUSCULAR | Status: DC | PRN

## 2023-02-03 MED ORDER — OXYCODONE-ACETAMINOPHEN 5-325 MG PO TABS: 1.0000 | ORAL_TABLET | ORAL | Status: DC | PRN

## 2023-02-03 MED ORDER — MISOPROSTOL 200 MCG PO TABS
ORAL_TABLET | ORAL | Status: AC
  Filled 2023-02-03: qty 4

## 2023-02-03 MED ORDER — TRANEXAMIC ACID-NACL 1000-0.7 MG/100ML-% IV SOLN
INTRAVENOUS | Status: AC
  Filled 2023-02-03: qty 100

## 2023-02-03 MED ORDER — INSULIN ASPART 100 UNIT/ML IJ SOLN: 0.0000 [IU] | INTRAMUSCULAR | Status: DC

## 2023-02-03 MED ORDER — LACTATED RINGERS IV SOLN
INTRAVENOUS | Status: DC
  Administered 2023-02-03: 125 mL/h via INTRAVENOUS

## 2023-02-03 NOTE — Progress Notes (Signed)
Charlotte Cohen is a 22 y.o. G1P0 at [redacted]w[redacted]d admitted for rupture of membranes.  Subjective: - breathing trough contractions, pain managed with IV pain meds, thinking of an epidural soon - supported by partner and family  Objective: BP 110/67   Pulse 96   Temp 97.9 F (36.6 C) (Oral)   Resp 18   Ht 5\' 3"  (1.6 m)   Wt 92.1 kg   LMP 05/06/2022 (Exact Date)   SpO2 98%   BMI 35.96 kg/m  No intake/output data recorded. No intake/output data recorded.  FHT:  FHR: 115 bpm, variability: moderate,  accelerations:  Abscent,  decelerations:  Absent UC:   regular, every 2-3.5 minutes SVE:   Dilation: 4 Effacement (%): 90 Station: [place], -2 Exam by:: Drenda Freeze CNM   Labs: Lab Results  Component Value Date   WBC 10.7 (H) 02/03/2023   HGB 11.1 (L) 02/03/2023   HCT 35.0 (L) 02/03/2023   MCV 90.2 02/03/2023   PLT 209 02/03/2023    Assessment / Plan: Augmentation of labor, progressing well  Labor: Progressing normally on pitocin Preeclampsia:   none Fetal Wellbeing:  Category II Pain Control:  IV pain meds, plans an epidural I/D:  n/a Anticipated MOD:  NSVD  Karis Juba, Student-MidWife 02/03/2023, 3:53 PM

## 2023-02-03 NOTE — Progress Notes (Addendum)
Charlotte Cohen is a 22 y.o. G1P0 at [redacted]w[redacted]d  admitted for rupture of membranes. On Pitocin for augmentation.  Subjective: - coping well & breathing with contractions - support person present  Objective: BP 123/75   Pulse 83   Temp 97.9 F (36.6 C) (Oral)   Resp 18   Ht 5\' 3"  (1.6 m)   Wt 92.1 kg   LMP 05/06/2022 (Exact Date)   SpO2 98%   BMI 35.96 kg/m  No intake/output data recorded. No intake/output data recorded.  FHT:  FHR: 125 bpm, variability: moderate,  accelerations:  Present,  decelerations:  Absent UC:   regular, every 4-6 minutes SVE:   Dilation: 4 Effacement (%): 90 Station: [place], -2 Exam by:: Drenda Freeze CNM  Labs: Lab Results  Component Value Date   WBC 10.7 (H) 02/03/2023   HGB 11.1 (L) 02/03/2023   HCT 35.0 (L) 02/03/2023   MCV 90.2 02/03/2023   PLT 209 02/03/2023    Assessment / Plan: Spontaneous labor, progressing normally  Labor: Progressing normally Preeclampsia:   N/A Fetal Wellbeing:  Category I Pain Control:  discussed options, plans for epidural I/D:  n/a Anticipated MOD:  NSVD  Karis Juba, Student-MidWife 02/03/2023, 1:04 PM

## 2023-02-03 NOTE — Anesthesia Procedure Notes (Signed)
Epidural Patient location during procedure: OB Start time: 02/03/2023 3:58 PM End time: 02/03/2023 4:18 PM  Staffing Anesthesiologist: Mal Amabile, MD Performed: anesthesiologist   Preanesthetic Checklist Completed: patient identified, IV checked, site marked, risks and benefits discussed, surgical consent, monitors and equipment checked, pre-op evaluation and timeout performed  Epidural Patient position: sitting Prep: DuraPrep and site prepped and draped Patient monitoring: continuous pulse ox and blood pressure Approach: midline Location: L3-L4 Injection technique: LOR air  Needle:  Needle type: Tuohy  Needle gauge: 17 G Needle length: 9 cm and 9 Needle insertion depth: 6 cm Catheter type: closed end flexible Catheter size: 19 Gauge Catheter at skin depth: 11 cm Test dose: negative and Other  Assessment Events: blood not aspirated, no cerebrospinal fluid, injection not painful, no injection resistance, no paresthesia and negative IV test  Additional Notes Patient identified. Risks and benefits discussed including failed block, incomplete  Pain control, post dural puncture headache, nerve damage, paralysis, blood pressure Changes, nausea, vomiting, reactions to medications-both toxic and allergic and post Partum back pain. All questions were answered. Patient expressed understanding and wished to proceed. Sterile technique was used throughout procedure. Epidural site was Dressed with sterile barrier dressing. No paresthesias, signs of intravascular injection Or signs of intrathecal spread were encountered.  Patient was more comfortable after the epidural was dosed. Please see RN's note for documentation of vital signs and FHR which are stable. Reason for block:procedure for pain

## 2023-02-03 NOTE — Anesthesia Preprocedure Evaluation (Signed)
Anesthesia Evaluation  Patient identified by MRN, date of birth, ID band Patient awake    Reviewed: Allergy & Precautions, Patient's Chart, lab work & pertinent test results  Airway Mallampati: II       Dental no notable dental hx. (+) Teeth Intact   Pulmonary asthma    Pulmonary exam normal        Cardiovascular negative cardio ROS Normal cardiovascular exam Rhythm:Regular     Neuro/Psych  Headaches  negative psych ROS   GI/Hepatic Neg liver ROS,,,S/P sleeve gastrectomy   Endo/Other  diabetes, Gestational  Obesity  Renal/GU negative Renal ROS  negative genitourinary   Musculoskeletal negative musculoskeletal ROS (+)    Abdominal  (+) + obese  Peds  Hematology  (+) Blood dyscrasia, anemia   Anesthesia Other Findings   Reproductive/Obstetrics (+) Pregnancy                             Anesthesia Physical Anesthesia Plan  ASA: 2  Anesthesia Plan: Epidural   Post-op Pain Management:    Induction: Intravenous  PONV Risk Score and Plan:   Airway Management Planned: Natural Airway  Additional Equipment:   Intra-op Plan:   Post-operative Plan:   Informed Consent: I have reviewed the patients History and Physical, chart, labs and discussed the procedure including the risks, benefits and alternatives for the proposed anesthesia with the patient or authorized representative who has indicated his/her understanding and acceptance.       Plan Discussed with: Anesthesiologist  Anesthesia Plan Comments:        Anesthesia Quick Evaluation

## 2023-02-03 NOTE — H&P (Signed)
OBSTETRIC ADMISSION HISTORY AND PHYSICAL  Charlotte Cohen is a 22 y.o. female G1P0 with IUP at [redacted]w[redacted]d by LMP presenting for PPROM. She reports +FMs, No LOF, no VB, no blurry vision, headaches or peripheral edema, and RUQ pain.  She plans on breast feeding. She request undecided for birth control. She received her prenatal care at  [facility]    Dating: By LMP --->  Estimated Date of Delivery: 02/27/23  Sono:    @[redacted]w[redacted]d , CWD, normal anatomy, cephalic presentation,  anterior placenta.  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   WISDOM TOOTH EXTRACTION  2021   four;    Obstetrical History: OB History     Gravida  1   Para      Term      Preterm      AB      Living         SAB      IAB      Ectopic      Multiple      Live Births              Social History Social History   Socioeconomic History   Marital status: Married    Spouse name: Luis   Number of children: 0   Years of education: 12   Highest education level: Not on file  Occupational History   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  Tobacco Use   Smoking status: Never   Smokeless tobacco: Never  Vaping Use   Vaping Use: Never used  Substance and Sexual Activity   Alcohol use: Never   Drug use: Never   Sexual activity: Yes    Partners: Male    Birth control/protection: None  Other Topics Concern   Not on file  Social History Narrative   Not on file   Social Determinants of Health   Financial Resource Strain: Medium Risk (07/18/2022)   Overall Financial Resource Strain (CARDIA)    Difficulty of Paying Living Expenses: Somewhat hard  Food Insecurity: No Food Insecurity (07/18/2022)   Hunger Vital Sign    Worried About  Running Out of Food in the Last Year: Never true    Ran Out of Food in the Last Year: Never true  Transportation Needs: No Transportation Needs (07/18/2022)   PRAPARE - Administrator, Civil Service (Medical): No    Lack of Transportation (Non-Medical): No  Physical Activity: Insufficiently Active (07/18/2022)   Exercise Vital Sign    Days of Exercise per Week: 5 days    Minutes of Exercise per Session: 20 min  Stress: No Stress Concern Present (07/18/2022)   [employer] of Occupational Health - Occupational Stress Questionnaire    Feeling of Stress : Not at all  Social Connections: Moderately Isolated (07/18/2022)   Social Connection and Isolation Panel [NHANES]    Frequency of Communication with Friends and Family: Once a week    Frequency of Social Gatherings with Friends and Family: Once a week    Attends Religious Services: More than 4 times per year    Active Member of [employer] or Organizations: No    Attends Banker Meetings: Never  Marital Status: Married    Family History: Family History  Problem Relation Age of Onset   Healthy Mother    Healthy Father    Healthy Sister    Healthy Sister    Healthy Brother    Healthy Brother    Healthy Brother    Eczema Brother    Cancer Maternal Grandmother        vaginal/uterine   Eczema Maternal Grandfather    Eczema Paternal Grandmother    Eczema Paternal Grandfather     Allergies: No Known Allergies  Medications Prior to Admission  Medication Sig Dispense Refill Last Dose   Accu-Chek Softclix Lancets lancets Check blood sugar 4 times daily; fasting and 2 hours after each meal 100 each 12    albuterol (PROVENTIL) (2.5 MG/3ML) 0.083% nebulizer solution Take 3 mLs (2.5 mg total) by nebulization every 6 (six) hours as needed for wheezing or shortness of breath. 75 mL 12    aspirin 81 MG chewable tablet Chew 1 tablet (81 mg total) by mouth daily.      Blood Glucose Monitoring Suppl (ACCU-CHEK GUIDE)  w/Device KIT Check blood sugars for fasting, and two hours after breakfast, lunch and dinner (4 checks daily) 1 kit 0    calcium carbonate (TUMS - DOSED IN MG ELEMENTAL CALCIUM) 500 MG chewable tablet Chew by mouth.      cetirizine (ZYRTEC) 10 MG tablet Take 1 tablet (10 mg total) by mouth daily. 90 tablet 1    docusate sodium (COLACE) 250 MG capsule Take 1 capsule (250 mg total) by mouth 2 (two) times daily. 30 capsule 0    fluticasone (FLONASE) 50 MCG/ACT nasal spray Place 2 sprays into both nostrils daily. 16 g 2    glucose blood (ACCU-CHEK GUIDE) test strip Use as instructed 100 each 12    montelukast (SINGULAIR) 10 MG tablet Take 1 tablet (10 mg total) by mouth at bedtime. 30 tablet 3    terconazole (TERAZOL 7) 0.4 % vaginal cream Place 1 applicator vaginally at bedtime. 45 g 0      Review of Systems   All systems reviewed and negative except as stated in HPI  Blood pressure 129/71, pulse (!) 111, temperature 98.1 F (36.7 C), temperature source Oral, resp. rate 16, height 5\' 3"  (1.6 m), weight 92.1 kg, last menstrual period 05/06/2022. General appearance: alert, cooperative, and appears stated age Lungs: normal work of breathing Heart: regular rate and rhythm Abdomen: soft, non-tender; bowel sounds normal Pelvic: 3.5/80/-2 Extremities: Homans sign is negative, no sign of DVT Presentation: cephalic Fetal monitoringBaseline: 130 bpm, Variability: Good {> 6 bpm), Accelerations: Reactive, and Decelerations: Absent Uterine activity every 5 min     Prenatal labs: ABO, Rh: O/Positive/-- (11/06 1456) Antibody: Negative (11/06 1456) Rubella: 1.79 (11/06 1456) RPR: Non Reactive (04/09 1047)  HBsAg: Negative (11/06 1456)  HIV: Non Reactive (04/09 1047)  GBS:    1 hr Glucola failed Genetic screening  LR female Anatomy US Normal  Prenatal Transfer Tool  Maternal Diabetes: Yes:  Diabetes Type:  Diet controlled Genetic Screening: Normal Maternal Ultrasounds/Referrals: Normal Fetal  Ultrasounds or other Referrals:  Referred to Materal Fetal Medicine  Maternal Substance Abuse:  No Significant Maternal Medications:  None Significant Maternal Lab Results:  Other: GBS unknown Number of Prenatal Visits:greater than 3 verified prenatal visits Other Comments:  None  Results for orders placed or performed during the hospital encounter of 02/03/23 (from the past 24 hour(s))  POCT fern test   Collection Time: 02/03/23  2:25 AM  Result Value Ref Range   POCT Fern Test Positive = ruptured amniotic membanes     Patient Active Problem List   Diagnosis Date Noted   Gestational diabetes mellitus (GDM) affecting first pregnancy 12/24/2022   ASCUS with positive high risk HPV cervical 12/19/2022   Supervision of normal pregnancy 07/18/2022   Asthma 04/10/2020    Assessment/Plan:  Charlotte Cohen is a 21 y.o. G1P0 at [redacted]w[redacted]d here for PPROM  #Labor:ROM this morning. Progressing spontaneously, will continue to monitor and consider augmentation if no change at next check  #Pain: Per patient request #FWB: Cat 1  #ID:  GBS unknown, PCN #MOF: breast #MOC:undecided #GDMA1: CBG monitoring Q4H  Celedonio Savage, MD  02/03/2023, 2:31 AM

## 2023-02-03 NOTE — Progress Notes (Signed)
Charlotte Cohen is a 22 y.o. G1P0 at [redacted]w[redacted]d by LMP admitted for PROM  Subjective: - comfortable with epidural - well supported by partner and family - pain at urethra after catheter placement; removed and provided Lidocaine gel, and re attempted. Still feeling pain, used bedpan instead; able to void adequately.  Objective: BP 122/62   Pulse (!) 112   Temp 97.8 F (36.6 C) (Oral)   Resp 18   Ht 5\' 3"  (1.6 m)   Wt 92.1 kg   LMP 05/06/2022 (Exact Date)   SpO2 98%   BMI 35.96 kg/m  No intake/output data recorded. No intake/output data recorded.  FHT:  FHR: 125 bpm, variability: moderate,  accelerations:  Present,  decelerations:  Absent UC:   regular, every 1-3 minutes, lasting 60-100 seconds SVE:   Dilation: 7.5 Effacement (%): 90 Station: -2 Exam by:: Foye Clock RN  Labs: Lab Results  Component Value Date   WBC 10.7 (H) 02/03/2023   HGB 11.1 (L) 02/03/2023   HCT 35.0 (L) 02/03/2023   MCV 90.2 02/03/2023   PLT 209 02/03/2023    Assessment / Plan: Augmentation of labor, progressing well.   Labor: Progressing normally, on pitocin Preeclampsia:   none Fetal Wellbeing:  Category I Pain Control:  Epidural I/D:  n/a Anticipated MOD:  NSVD  Karis Juba, Student-MidWife 02/03/2023, 7:12 PM

## 2023-02-03 NOTE — MAU Note (Signed)
Pt informed that the ultrasound is considered a limited OB ultrasound and is not intended to be a complete ultrasound exam.  Patient also informed that the ultrasound is not being completed with the intent of assessing for fetal or placental anomalies or any pelvic abnormalities.  Explained that the purpose of today's ultrasound is to assess for presentation.  Patient acknowledges the purpose of the exam and the limitations of the study.    [facility] Presentation verified.  

## 2023-02-03 NOTE — MAU Note (Addendum)
.  Charlotte Cohen is a 21 y.o. at [redacted]w[redacted]d here in MAU reporting:   Irregular contractions Pain score: 4/10  ROM: Possible ROM Vaginal Bleeding: None Last SVE: 0    FHT:130 via Patient taken straight to room   OB Office: Outside Facility GBS: Unknown HSV: Denies hx of HSV Lab orders placed from triage: MAU Labor Eval

## 2023-02-03 NOTE — Progress Notes (Signed)
Patient ID: Charlotte Cohen, female   DOB: December 10, 2000, 22 y.o.   MRN: [id number] LABOR NOTE Charlotte Cohen is a 22 y.o. G1P0 at [redacted]w[redacted]d admitted for PPROM  Subjective: feeling pressure  Objective: BP (!) 103/44   Pulse (!) 125   Temp 98.4 F (36.9 C)   Resp 18   Ht 5\' 3"  (1.6 m)   Wt 92.1 kg   LMP 05/06/2022 (Exact Date)   SpO2 98%   BMI 35.96 kg/m  No intake/output data recorded.  FHR baseline 135 bpm, Variability: moderate, Accelerations:present, Decelerations:  Absent Toco: q 1-3 mins   SVE:   Dilation: 8 Effacement (%): 90 Station: 0 Exam by:: Adline Potter, RN  Pitocin @ 18 mu/min  Labs: Lab Results  Component Value Date   WBC 10.7 (H) 02/03/2023   HGB 11.1 (L) 02/03/2023   HCT 35.0 (L) 02/03/2023   MCV 90.2 02/03/2023   PLT 209 02/03/2023   CBG (last 3)  Recent Labs    02/03/23 0755 02/03/23 1207 02/03/23 1655  GLUCAP 80 86 73    Assessment / Plan: IOL d/t PPROM, pit @ 18, making progress  Labor: active Fetal Wellbeing:  Category I Pain Control:  epidural Pre-eclampsia: N/A I/D:   PCN for GBS unknown and preterm Anticipated MOD: NSVB  Cheral Marker CNM, WHNP-BC 02/03/2023, 9:18 PM

## 2023-02-03 NOTE — Progress Notes (Signed)
Foley catheter placement attempted following epidural placement after patient was comfortable. Patient complained of burning pain around urethral opening. Attempted once more an 45 minutes later with no success as patient still complained of burning pain around urethra. Received order for lidocaine gel and applied to area once sent from pharmacy. Patient was still uncomfortable 30 minutes later and requested bedpan. Emptied of urine from bedpan. Will have oncoming RN attempt to place foley catheter.

## 2023-02-04 ENCOUNTER — Encounter (HOSPITAL_COMMUNITY): Payer: Self-pay | Admitting: Obstetrics and Gynecology

## 2023-02-04 LAB — CBC
HCT: 31 % — ABNORMAL LOW (ref 36.0–46.0)
Hemoglobin: 10.4 g/dL — ABNORMAL LOW (ref 12.0–15.0)
MCH: 29.1 pg (ref 26.0–34.0)
MCHC: 33.5 g/dL (ref 30.0–36.0)
MCV: 86.8 fL (ref 80.0–100.0)
Platelets: 209 10*3/uL (ref 150–400)
RBC: 3.57 MIL/uL — ABNORMAL LOW (ref 3.87–5.11)
RDW: 15 % (ref 11.5–15.5)
WBC: 14.7 10*3/uL — ABNORMAL HIGH (ref 4.0–10.5)
nRBC: 0.3 % — ABNORMAL HIGH (ref 0.0–0.2)

## 2023-02-04 LAB — GLUCOSE, CAPILLARY: Glucose-Capillary: 72 mg/dL (ref 70–99)

## 2023-02-04 MED ORDER — TETANUS-DIPHTH-ACELL PERTUSSIS 5-2.5-18.5 LF-MCG/0.5 IM SUSY: 0.5000 mL | PREFILLED_SYRINGE | Freq: Once | INTRAMUSCULAR | Status: DC

## 2023-02-04 MED ORDER — BISACODYL 10 MG RE SUPP: 10.0000 mg | Freq: Every day | RECTAL | Status: DC | PRN

## 2023-02-04 MED ORDER — ZOLPIDEM TARTRATE 5 MG PO TABS: 5.0000 mg | ORAL_TABLET | Freq: Every evening | ORAL | Status: DC | PRN

## 2023-02-04 MED ORDER — ALBUTEROL SULFATE (2.5 MG/3ML) 0.083% IN NEBU: 2.5000 mg | INHALATION_SOLUTION | Freq: Four times a day (QID) | RESPIRATORY_TRACT | Status: DC | PRN

## 2023-02-04 MED ORDER — METHYLERGONOVINE MALEATE 0.2 MG/ML IJ SOLN: 0.2000 mg | Freq: Once | INTRAMUSCULAR | Status: AC

## 2023-02-04 MED ORDER — MONTELUKAST SODIUM 10 MG PO TABS
10.0000 mg | ORAL_TABLET | Freq: Every day | ORAL | Status: DC
  Administered 2023-02-05: 10 mg via ORAL
  Filled 2023-02-04: qty 1

## 2023-02-04 MED ORDER — SENNOSIDES-DOCUSATE SODIUM 8.6-50 MG PO TABS
2.0000 | ORAL_TABLET | Freq: Every day | ORAL | Status: DC
  Administered 2023-02-05: 2 via ORAL
  Filled 2023-02-04: qty 2

## 2023-02-04 MED ORDER — PRENATAL MULTIVITAMIN CH
1.0000 | ORAL_TABLET | Freq: Every day | ORAL | Status: DC
  Administered 2023-02-04 – 2023-02-05 (×2): 1 via ORAL
  Filled 2023-02-04 (×2): qty 1

## 2023-02-04 MED ORDER — WITCH HAZEL-GLYCERIN EX PADS: 1.0000 | MEDICATED_PAD | CUTANEOUS | Status: DC | PRN

## 2023-02-04 MED ORDER — FLEET ENEMA 7-19 GM/118ML RE ENEM: 1.0000 | ENEMA | Freq: Every day | RECTAL | Status: DC | PRN

## 2023-02-04 MED ORDER — ONDANSETRON HCL 4 MG/2ML IJ SOLN: 4.0000 mg | INTRAMUSCULAR | Status: DC | PRN

## 2023-02-04 MED ORDER — MEASLES, MUMPS & RUBELLA VAC IJ SOLR: 0.5000 mL | Freq: Once | INTRAMUSCULAR | Status: DC

## 2023-02-04 MED ORDER — SODIUM CHLORIDE 0.9 % IV SOLN: 250.0000 mL | INTRAVENOUS | Status: DC | PRN

## 2023-02-04 MED ORDER — ACETAMINOPHEN 325 MG PO TABS
650.0000 mg | ORAL_TABLET | ORAL | Status: DC | PRN
  Administered 2023-02-04 – 2023-02-05 (×4): 650 mg via ORAL
  Filled 2023-02-04 (×5): qty 2

## 2023-02-04 MED ORDER — DIBUCAINE (PERIANAL) 1 % EX OINT: 1.0000 | TOPICAL_OINTMENT | CUTANEOUS | Status: DC | PRN

## 2023-02-04 MED ORDER — SIMETHICONE 80 MG PO CHEW: 80.0000 mg | CHEWABLE_TABLET | ORAL | Status: DC | PRN

## 2023-02-04 MED ORDER — ONDANSETRON HCL 4 MG PO TABS: 4.0000 mg | ORAL_TABLET | ORAL | Status: DC | PRN

## 2023-02-04 MED ORDER — METHYLERGONOVINE MALEATE 0.2 MG/ML IJ SOLN
INTRAMUSCULAR | Status: AC
  Administered 2023-02-04: 0.2 mg via INTRAMUSCULAR
  Filled 2023-02-04: qty 1

## 2023-02-04 MED ORDER — COCONUT OIL OIL: 1.0000 | TOPICAL_OIL | Status: DC | PRN

## 2023-02-04 MED ORDER — OXYCODONE HCL 5 MG PO TABS
5.0000 mg | ORAL_TABLET | ORAL | Status: DC | PRN
  Administered 2023-02-04 – 2023-02-05 (×3): 5 mg via ORAL
  Filled 2023-02-04 (×3): qty 1

## 2023-02-04 MED ORDER — KETOROLAC TROMETHAMINE 15 MG/ML IJ SOLN: 15.0000 mg | Freq: Four times a day (QID) | INTRAMUSCULAR | Status: DC | PRN

## 2023-02-04 MED ORDER — FLUTICASONE PROPIONATE 50 MCG/ACT NA SUSP
2.0000 | Freq: Every day | NASAL | Status: DC
  Filled 2023-02-04: qty 16

## 2023-02-04 MED ORDER — DIPHENHYDRAMINE HCL 25 MG PO CAPS: 25.0000 mg | ORAL_CAPSULE | Freq: Four times a day (QID) | ORAL | Status: DC | PRN

## 2023-02-04 MED ORDER — SODIUM CHLORIDE 0.9% FLUSH: 3.0000 mL | INTRAVENOUS | Status: DC | PRN

## 2023-02-04 MED ORDER — SODIUM CHLORIDE 0.9% FLUSH: 3.0000 mL | Freq: Two times a day (BID) | INTRAVENOUS | Status: DC

## 2023-02-04 MED ORDER — BENZOCAINE-MENTHOL 20-0.5 % EX AERO
1.0000 | INHALATION_SPRAY | CUTANEOUS | Status: DC | PRN
  Filled 2023-02-04: qty 56

## 2023-02-04 NOTE — Discharge Summary (Signed)
Postpartum Discharge Summary  Date of Service updated***     Patient Name: Charlotte Cohen DOB: [birth year] MRN: [id number]  Date of admission: 02/03/2023 Delivery date:02/03/2023  Delivering provider:   Date of discharge: 02/04/2023  Admitting diagnosis: Indication for care in labor or delivery [O75.9] Intrauterine pregnancy: [redacted]w[redacted]d     Secondary diagnosis:  Principal Problem:   Indication for care in labor or delivery Active Problems:   Asthma   Gestational diabetes mellitus (GDM) affecting first pregnancy   Preterm premature rupture of membranes (PPROM) with unknown onset of labor  Additional problems: Mild PPH, EBL (TXA, cytotec, methergine, LUS sweep)    Discharge diagnosis: Preterm Pregnancy Delivered, GDM A1, and PPH                                              Post partum procedures:{Postpartum procedures:23558} Augmentation: Pitocin Complications: mild [facility]  Hospital course: Induction of Labor With Vaginal Delivery   22 y.o. yo G1P0 at [redacted]w[redacted]d was admitted to the hospital 02/03/2023 for induction of labor.  Indication for induction:  PPROM .  Patient had an labor course complicated by nothing Membrane Rupture Time/Date: 1:30 AM ,02/03/2023   Delivery Method:Vaginal, Spontaneous  Episiotomy:  none Lacerations:  Vaginal  Details of delivery can be found in separate delivery note.  Patient had a postpartum course complicated by***. Patient is discharged home 02/04/23.  Newborn Data: Birth date:02/03/2023  Birth time:11:35 PM  Gender:Female  Living status:Living  Apgars: ,  Weight:   Magnesium Sulfate received: {Mag received:30440022} BMZ received: No Rhophylac:N/A MMR:N/A T-DaP:{Tdap:23962} Flu: N/A Transfusion:{Transfusion received:30440034}  Physical exam  Vitals:   02/03/23 2331 02/03/23 2346 02/04/23 0002 02/04/23 0012  BP: 118/67 123/66 (!) 117/52 128/62  Pulse: (!) 131 (!) 115 (!) 108 (!) 105  Resp:      Temp:    98.1 F (36.7 C)   TempSrc:    Oral  SpO2:      Weight:      Height:       General: {Exam; general:21111117} Lochia: {Desc; appropriate/inappropriate:30686::"appropriate"} Uterine Fundus: {Desc; firm/soft:30687} Incision: {Exam; incision:21111123} DVT Evaluation: {Exam; dvt:2111122} Labs: Lab Results  Component Value Date   WBC 10.7 (H) 02/03/2023   HGB 11.1 (L) 02/03/2023   HCT 35.0 (L) 02/03/2023   MCV 90.2 02/03/2023   PLT 209 02/03/2023      Latest Ref Rng & Units 07/14/2022    1:15 PM  CMP  Glucose 70 - 99 mg/dL 80   BUN 6 - 20 mg/dL 8   Creatinine 4.09 - 8.11 mg/dL 9.14   Sodium 782 - 956 mmol/L 137   Potassium 3.5 - 5.1 mmol/L 3.5   Chloride 98 - 111 mmol/L 106   CO2 22 - 32 mmol/L 22   Calcium 8.9 - 10.3 mg/dL 9.5   Total Protein 6.5 - 8.1 g/dL 7.0   Total Bilirubin 0.3 - 1.2 mg/dL 0.5   Alkaline Phos 38 - 126 U/L 47   AST 15 - 41 U/L 18   ALT 0 - 44 U/L 12    Edinburgh Score:    07/18/2022    9:35 AM  Edinburgh Postnatal Depression Scale Screening Tool  I have been able to laugh and see the funny side of things. 0  I have looked forward with enjoyment to things. 0  I have blamed myself unnecessarily  when things went wrong. 2  I have been anxious or worried for no good reason. 0  I have felt scared or panicky for no good reason. 0  Things have been getting on top of me. 0  I have been so unhappy that I have had difficulty sleeping. 0  I have felt sad or miserable. 2  I have been so unhappy that I have been crying. 0  The thought of harming myself has occurred to me. 0  Edinburgh Postnatal Depression Scale Total 4     After visit meds:  Allergies as of 02/04/2023   No Known Allergies   Med Rec must be completed prior to using this [facility]***        Discharge home in stable condition Infant Feeding: {Baby feeding:23562} Infant Disposition:{CHL IP OB HOME WITH OZHYQM:57846} Discharge instruction: per After Visit Summary and Postpartum booklet. Activity:  Advance as tolerated. Pelvic rest for 6 weeks.  Diet: {OB NGEX:52841324} Future Appointments:No future appointments.  Follow up Visit: With providers at [facility]   02/04/2023 Cheral Marker, CNM

## 2023-02-04 NOTE — Lactation Note (Signed)
This note was copied from a baby's chart. Lactation Consultation Note  Patient Name: Charlotte Cohen [id number] Date: 02/04/2023 Age:23 hours Reason for consult: L&D Initial assessment;Late-preterm 34-36.6wks;Primapara;Maternal endocrine disorder L&D called LC d/t mom GDM and baby will not latch to breast. LPI sleepy not wanting to be bothered. Mom sat baby up right, LC spoon fed colostrum that was hand expressed from mom. Baby tongue thrust some colostrum out. LC massaged cheeks for stimulation to swallow. Attempted suck training w/gloved finger. Baby has no interest in suckling. Baby would chew a couple of times. Mom placed baby back STS on her chest after finished spoon feeding.   Mom is BF/formula feeding. Suggested mom supplement w/her colostrum or if needed some formula as well. Will see how baby is doing once on the floor and set mom up w/DEBP.  Maternal Data Has patient been taught Hand Expression?: Yes Does the patient have breastfeeding experience prior to this delivery?: No  Feeding    LATCH Score Latch: Too sleepy or reluctant, no latch achieved, no sucking elicited.  Audible Swallowing: None  Type of Nipple: Everted at rest and after stimulation  Comfort (Breast/Nipple): Soft / non-tender  Hold (Positioning): Full assist, staff holds infant at breast  LATCH Score: 4   Lactation Tools Discussed/Used    Interventions Interventions: Skin to skin;Breast massage;Hand express;Breast compression;Support pillows  Discharge    Consult Status Consult Status: Follow-up from L&D Date: 02/04/23 Follow-up type: In-patient    Charyl Dancer 02/04/2023, 1:54 AM

## 2023-02-04 NOTE — Progress Notes (Signed)
Post Partum Day 1 Subjective: Drinking, voiding, ambulating well. Has eaten a few chicken nuggets w/o n/v.  +flatus.  Lochia wnl. Intense cramping, received 2 oxycodone in L&D.  Denies dizziness, lightheadedness, or sob.   Objective: Blood pressure 136/78, pulse 90, temperature 99.7 F (37.6 C), temperature source Oral, resp. rate 18, height 5\' 3"  (1.6 m), weight 92.1 kg, last menstrual period 05/06/2022, SpO2 98 %.  Physical Exam:  General: alert, cooperative, and no distress Lochia: appropriate Uterine Fundus: firm Incision: n/a DVT Evaluation: No evidence of DVT seen on physical exam. Negative Homan's sign. No cords or calf tenderness. No significant calf/ankle edema.  Recent Labs    02/03/23 0247 02/04/23 0346  HGB 11.1* 10.4*  HCT 35.0* 31.0*    Assessment/Plan: Plan for discharge tomorrow, Breastfeeding, Lactation consult, and Contraception undecided   LOS: 1 day   Cheral Marker, CNM 02/04/2023, 5:28 AM

## 2023-02-04 NOTE — Anesthesia Postprocedure Evaluation (Signed)
Anesthesia Post Note  Patient: Charlotte Cohen  Procedure(s) Performed: AN AD HOC LABOR EPIDURAL     Patient location during evaluation: Mother Baby Anesthesia Type: Epidural Level of consciousness: awake and alert Pain management: pain level controlled Vital Signs Assessment: post-procedure vital signs reviewed and stable Respiratory status: spontaneous breathing, nonlabored ventilation and respiratory function stable Cardiovascular status: stable Postop Assessment: no headache, no backache and epidural receding Anesthetic complications: no  No notable events documented.  Last Vitals:  Vitals:   02/04/23 0340 02/04/23 0815  BP: 136/78 112/79  Pulse: 90 98  Resp: 18 18  Temp: 37.6 C 36.9 C  SpO2: 98% 99%    Last Pain:  Vitals:   02/04/23 0815  TempSrc: Oral  PainSc:    Pain Goal: Patients Stated Pain Goal: 0 (02/03/23 0211)                 Emmaline Kluver N

## 2023-02-04 NOTE — Lactation Note (Signed)
This note was copied from a baby's chart. Lactation Consultation Note  Patient Name: Charlotte Cohen [id number] Date: 02/04/2023 Age:23 hours Reason for consult: Initial assessment;Late-preterm 34-36.6wks;Primapara;1st time breastfeeding;Maternal endocrine disorder Mom shared she has a HA and feels exhausted, desires to rest.  Per dad baby recently fed at 10 am - 14 ml of formula and large stool.  Baby presently sleeping.  LC reviewed BF goals for 36 4/7 weeks baby, feed with feeding cues and by 3 hours to off the breast 1st , if baby latches 15 -20 mins and then supplement.  After feeding, post pump both breast, save the milk for the next feeding.    Maternal Data  Per patient's hx - Bariatric surgery in 2022   Feeding Mother's Current Feeding Choice: Breast Milk and Formula  LATCH Score - 1st LS with LC in L/D =4     Lactation Tools Discussed/Used Tools: Pump (in the room and needs to be reviewed with mom , presently mom has HA and expressed she is exhausted and plans to rest. LC will F/U with the nurse) Breast pump type: Double-Electric Breast Pump Pump Education: Milk Storage  Interventions Interventions: Breast feeding basics reviewed;Education;DEBP;LC Services brochure  Discharge Pump: Personal;DEBP (per mom Spectra)  Consult Status Consult Status: Follow-up Date: 02/04/23 Follow-up type: In-patient    Matilde Sprang Maeola Mchaney 02/04/2023, 11:25 AM

## 2023-02-05 ENCOUNTER — Ambulatory Visit (HOSPITAL_COMMUNITY): Payer: Self-pay

## 2023-02-05 LAB — SURGICAL PATHOLOGY

## 2023-02-05 MED ORDER — OXYCODONE HCL 5 MG PO TABS: 5.0000 mg | ORAL_TABLET | ORAL | 0 refills | Status: DC | PRN

## 2023-02-05 MED ORDER — ACETAMINOPHEN 325 MG PO TABS: 650.0000 mg | ORAL_TABLET | ORAL | 1 refills | Status: DC | PRN

## 2023-02-05 NOTE — Lactation Note (Addendum)
This note was copied from a baby's chart. Lactation Consultation Note  Patient Name: Charlotte Cohen [id number] Date: 02/05/2023 Age:23 hours Reason for consult: Breastfeeding assistance;Follow-up assessment;Late-preterm 34-36.6wks P1, LPTI ,LC entered the room, infant was cuing, Birth Parent wanted latch assistance, Birth Parent attempted to latch infant on her left breast using the football hold position, infant latched but did not elicit the suck and swallow response only held nipple in her mouth. Afterwards infant consume 5 mls of 22 kcal formula. Birth Parent informed LC infant taken 14 mls of 22 kcal formula at 12 am this morning. LC reviewed LPTI feeding policy and Birth Parent knows to offer 21 mls per feeding or more if infant wants it now that infant is past 24 hours on Day 2 of life. Birth Parent was using the DEBP when LC left the room. This is Birth Parent's 2nd time pumping, Per Careers information officer, she  expressed 11 mls of colostrum at 9 pm and will continue to try and pump every 3 hours for 15 minutes on initial setting. Birth Parent will continue to ask RN/LC for latch assistance if needed.   Current feeding plan: 1- Continue to follow LPTI feeding guidelines, latch infant first every feeding every 3 hours and limit chest/ bottle feeding ( Formula/ EBM) to 30 minutes or less. 2- Continue to supplement infant with every feeding Day 2 of life 21 mls or more of pumped EBM/ 22 kcal formula per feeding. 3- Continue to use DEBP every 3 hours for 15 minutes on initial setting and offer any EBM first before formula.   Maternal Data    Feeding Mother's Current Feeding Choice: Breast Milk and Formula Nipple Type: Extra Slow Flow  LATCH Score Latch: Too sleepy or reluctant, no latch achieved, no sucking elicited.  Audible Swallowing: None  Type of Nipple: Everted at rest and after stimulation  Comfort (Breast/Nipple): Soft / non-tender  Hold (Positioning): Assistance needed  to correctly position infant at breast and maintain latch.  LATCH Score: 5   Lactation Tools Discussed/Used    Interventions Interventions: Education;Pace feeding;Skin to skin;Assisted with latch;Support pillows;Breast compression;LPT handout/interventions  Discharge    Consult Status Consult Status: Follow-up Date: 02/05/23 Follow-up type: In-patient    Frederico Hamman 02/05/2023, 2:10 AM

## 2023-02-05 NOTE — Lactation Note (Signed)
This note was copied from a baby's chart. Lactation Consultation Note  Patient Name: Charlotte Cohen [id number] Date: 02/05/2023 Age:23 hours Reason for consult: Follow-up assessment;Late-preterm 34-36.6wks;1st time breastfeeding  P1, Assisted mother with pumping using DEBP and 21 mm flanges.  Recommend she order 20 mm flanges for home pump.  Assisted with latching using #20NS.  Prefilled with formula.  Baby was able to sustain latch for approx. 5 min with nipple shield.  Discussed keeping feedings to 30 min and wake if he has not fed in 30 min. Encouraged mother to pump q 3 hours.   Maternal Data Has patient been taught Hand Expression?: Yes Does the patient have breastfeeding experience prior to this delivery?: No  Feeding Mother's Current Feeding Choice: Breast Milk and Formula Nipple Type: Extra Slow Flow  LATCH Score Latch: Repeated attempts needed to sustain latch, nipple held in mouth throughout feeding, stimulation needed to elicit sucking reflex.  Audible Swallowing: A few with stimulation  Type of Nipple: Everted at rest and after stimulation  Comfort (Breast/Nipple): Soft / non-tender  Hold (Positioning): Assistance needed to correctly position infant at breast and maintain latch.  LATCH Score: 7   Lactation Tools Discussed/Used Tools: Pump;Flanges;Nipple Shields Nipple shield size: 20 Flange Size: 21 Breast pump type: Double-Electric Breast Pump Reason for Pumping: stimulation and supplementation Pumping frequency: q 3 hours  Interventions Interventions: Breast feeding basics reviewed;Assisted with latch;Hand express;Pre-pump if needed;Adjust position;Support pillows;Position options;Education;DEBP  Discharge Pump: Personal;DEBP  Consult Status Consult Status: Follow-up Date: 02/06/23 Follow-up type: In-patient    Dahlia Byes [facility] 02/05/2023, 2:23 PM

## 2023-02-11 ENCOUNTER — Telehealth (HOSPITAL_COMMUNITY): Payer: Self-pay | Admitting: *Deleted

## 2023-02-11 NOTE — Telephone Encounter (Signed)
Mom reports feeling good. No concerns about herself at this time. EPDS=5 [facility] score=0) Mom reports baby is doing well. Feeding, peeing, and pooping without difficulty. Reports some constipation in baby. Will ask pediatrician tomorrow for guidance. Safe sleep reviewed. Mom reports no concerns about baby at present.  Duffy Rhody, RN 02-11-2023 at 3:29pm

## 2023-02-12 DIAGNOSIS — Z3483 Encounter for supervision of other normal pregnancy, third trimester: Secondary | ICD-10-CM | POA: Diagnosis not present

## 2023-02-12 DIAGNOSIS — Z3482 Encounter for supervision of other normal pregnancy, second trimester: Secondary | ICD-10-CM | POA: Diagnosis not present

## 2023-03-12 ENCOUNTER — Ambulatory Visit: Payer: Medicaid Other | Admitting: Family Medicine

## 2023-04-09 DIAGNOSIS — E6609 Other obesity due to excess calories: Secondary | ICD-10-CM | POA: Diagnosis not present

## 2023-04-09 DIAGNOSIS — Z903 Acquired absence of stomach [part of]: Secondary | ICD-10-CM | POA: Diagnosis not present

## 2023-04-09 DIAGNOSIS — Z6833 Body mass index (BMI) 33.0-33.9, adult: Secondary | ICD-10-CM | POA: Diagnosis not present

## 2023-07-28 ENCOUNTER — Encounter: Payer: Self-pay | Admitting: *Deleted

## 2023-07-28 ENCOUNTER — Other Ambulatory Visit: Payer: Self-pay | Admitting: *Deleted

## 2023-07-29 ENCOUNTER — Ambulatory Visit: Payer: Medicaid Other | Admitting: *Deleted

## 2023-07-29 ENCOUNTER — Other Ambulatory Visit (INDEPENDENT_AMBULATORY_CARE_PROVIDER_SITE_OTHER): Payer: Medicaid Other

## 2023-07-29 VITALS — BP 130/71 | HR 87 | Wt 200.4 lb

## 2023-07-29 DIAGNOSIS — O0991 Supervision of high risk pregnancy, unspecified, first trimester: Secondary | ICD-10-CM | POA: Diagnosis not present

## 2023-07-29 DIAGNOSIS — Z3481 Encounter for supervision of other normal pregnancy, first trimester: Secondary | ICD-10-CM | POA: Diagnosis not present

## 2023-07-29 DIAGNOSIS — O0990 Supervision of high risk pregnancy, unspecified, unspecified trimester: Secondary | ICD-10-CM

## 2023-07-29 DIAGNOSIS — O3680X0 Pregnancy with inconclusive fetal viability, not applicable or unspecified: Secondary | ICD-10-CM

## 2023-07-29 DIAGNOSIS — Z3A08 8 weeks gestation of pregnancy: Secondary | ICD-10-CM | POA: Diagnosis not present

## 2023-07-29 DIAGNOSIS — Z1339 Encounter for screening examination for other mental health and behavioral disorders: Secondary | ICD-10-CM | POA: Diagnosis not present

## 2023-07-29 MED ORDER — PRENATAL PLUS VITAMIN/MINERAL 27-1 MG PO TABS
1.0000 | ORAL_TABLET | Freq: Every day | ORAL | 12 refills | Status: AC
Start: 2023-07-29 — End: ?

## 2023-07-29 MED ORDER — BLOOD PRESSURE KIT DEVI
1.0000 | 0 refills | Status: AC
Start: 2023-07-29 — End: ?

## 2023-07-29 NOTE — Patient Instructions (Signed)

## 2023-07-29 NOTE — Progress Notes (Addendum)
New OB Intake  I connected with Charlotte Cohen  on 07/29/23 at  9:15 AM EST by In Person Visit and verified that I am speaking with the correct person using two identifiers. Nurse is located at [facility] and pt is located at [place].  I discussed the limitations, risks, security and privacy concerns of performing an evaluation and management service by telephone and the availability of in person appointments. I also discussed with the patient that there may be a patient responsible charge related to this service. The patient expressed understanding and agreed to proceed.  I explained I am completing New OB Intake today. We discussed EDD of 02/26/2024, by Last Menstrual Period. Pt is G2P0101. I reviewed her allergies, medications and Medical/Surgical/OB history.    Patient Active Problem List   Diagnosis Date Noted   Supervision of high risk pregnancy, antepartum 07/29/2023   Indication for care in labor or delivery 02/03/2023   Preterm premature rupture of membranes (PPROM) with unknown onset of labor 02/03/2023   Gestational diabetes mellitus (GDM) affecting first pregnancy 12/24/2022   ASCUS with positive high risk HPV cervical 12/19/2022   Supervision of normal pregnancy 07/18/2022   Asthma 04/10/2020    Concerns addressed today  Delivery Plans Plans to deliver at [facility]. Discussed the nature of our practice with multiple providers including residents and students. Due to the size of the practice, the delivering provider may not be the same as those providing prenatal care.   Patient is not interested in water birth. Offered upcoming OB visit with CNM to discuss further.  MyChart/Babyscripts MyChart access verified. I explained pt will have some visits in office and some virtually. Babyscripts instructions given and order placed. Patient verifies receipt of registration text/e-mail. Account successfully created and app downloaded.  Blood Pressure Cuff/Weight  Scale Patient has private insurance; instructed to purchase blood pressure cuff and bring to first prenatal appt. Explained after first prenatal appt pt will check weekly and document in Babyscripts. Patient does not have weight scale; patient may purchase if they desire to track weight weekly in Babyscripts.  Anatomy US Explained first scheduled [place] will be around 19 weeks. Anatomy US scheduled for TBD at TBD.  Interested in [place]? If yes, send referral and doula dot phrase.   Is patient a candidate for Babyscripts Optimization? No - High Risk  First visit review I reviewed new OB appt with patient. Explained pt will be seen by Dr. Debroah Loop at first visit. Discussed Avelina Laine genetic screening with patient. Requests Panorama and Horizon.. Routine prenatal labs  not collected at today's visit per pt preference to defer to New OB.    Last Pap Diagnosis  Date Value Ref Range Status  07/29/2022 (A)  Final   - Atypical squamous cells of undetermined significance (ASC-US)    Harrel Lemon, RN 07/29/2023  9:54 AM

## 2023-08-25 ENCOUNTER — Other Ambulatory Visit (HOSPITAL_COMMUNITY)
Admission: RE | Admit: 2023-08-25 | Discharge: 2023-08-25 | Disposition: A | Discharge status: Home / Self Care | Payer: Medicaid Other | Source: Ambulatory Visit | Attending: Obstetrics & Gynecology | Admitting: Obstetrics & Gynecology

## 2023-08-25 ENCOUNTER — Ambulatory Visit: Payer: Medicaid Other | Admitting: Obstetrics & Gynecology

## 2023-08-25 VITALS — BP 107/73 | HR 78 | Wt 199.0 lb

## 2023-08-25 DIAGNOSIS — Z3A12 12 weeks gestation of pregnancy: Secondary | ICD-10-CM | POA: Diagnosis not present

## 2023-08-25 DIAGNOSIS — O0991 Supervision of high risk pregnancy, unspecified, first trimester: Secondary | ICD-10-CM

## 2023-08-25 DIAGNOSIS — O0990 Supervision of high risk pregnancy, unspecified, unspecified trimester: Secondary | ICD-10-CM

## 2023-08-25 DIAGNOSIS — Z1331 Encounter for screening for depression: Secondary | ICD-10-CM

## 2023-08-25 MED ORDER — FLUCONAZOLE 150 MG PO TABS: 150.0000 mg | ORAL_TABLET | Freq: Once | ORAL | 0 refills | Status: AC

## 2023-08-25 NOTE — Progress Notes (Signed)
NOB, c/o vaginal discharge.

## 2023-08-25 NOTE — Progress Notes (Signed)
Subjective:[redacted]w[redacted]d     Charlotte Cohen is a [id number] [redacted]w[redacted]d being seen today for her first obstetrical visit.  Her obstetrical history is significant for  recent delivery preterm  . Patient does intend to breast feed. Pregnancy history fully reviewed.  Patient reports nausea.  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   WISDOM TOOTH EXTRACTION  2021   four;   Family History  Problem Relation Age of Onset   Depression Mother    Anxiety disorder Mother    Healthy Mother    Bipolar disorder Mother    Bipolar disorder Father    Depression Father    Anxiety disorder Father    Healthy Father    Healthy Sister    Healthy Sister    Healthy Brother    Healthy Brother    Healthy Brother    Eczema Brother    Cancer Maternal Grandmother        vaginal/uterine   Eczema Maternal Grandfather    Eczema Paternal Grandmother    Eczema Paternal Grandfather      Exam    Uterus:   12 week  Pelvic Exam:    Perineum: No Hemorrhoids   Vulva: normal   Vagina:  curdlike discharge   pH:    Cervix: no lesions   Adnexa: normal adnexa   Bony Pelvis: average  System: Breast:  normal appearance, no masses or tenderness   Skin: normal coloration and turgor, no rashes    Neurologic: oriented, normal mood   Extremities: normal strength, tone, and muscle mass   HEENT PERRLA   Mouth/Teeth mucous membranes moist, pharynx normal without lesions and dental  hygiene good   Neck supple and no masses   Cardiovascular: regular rate and rhythm   Respiratory:  appears well, vitals normal, no respiratory distress, acyanotic, normal RR   Abdomen: soft, non-tender; bowel sounds normal; no masses,  no organomegaly   Urinary: urethral meatus normal      Assessment:    Pregnancy: G2P0101 Patient Active Problem List   Diagnosis Date Noted   Supervision of high risk pregnancy, antepartum 07/29/2023   Indication for care in labor or delivery 02/03/2023   Preterm premature rupture of membranes (PPROM) with unknown onset of labor 02/03/2023   Enlarged ovary 01/29/2023   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 Prenatal vitamins. Problem list reviewed and updated. Genetic Screening discussed : ordered.  Ultrasound discussed; fetal survey: ordered.  Follow up in 4 weeks. 50% of 30 min visit spent on counseling and coordination of care.  PNV   Scheryl Darter 08/25/2023

## 2023-08-26 LAB — CBC/D/PLT+RPR+RH+ABO+RUBIGG...
Antibody Screen: NEGATIVE
Basophils Absolute: 0.1 10*3/uL (ref 0.0–0.2)
Basos: 1 %
EOS (ABSOLUTE): 0.4 10*3/uL (ref 0.0–0.4)
Eos: 4 %
HCV Ab: NONREACTIVE
HIV Screen 4th Generation wRfx: NONREACTIVE
Hematocrit: 40.4 % (ref 34.0–46.6)
Hemoglobin: 13.3 g/dL (ref 11.1–15.9)
Hepatitis B Surface Ag: NEGATIVE
Immature Grans (Abs): 0 10*3/uL (ref 0.0–0.1)
Immature Granulocytes: 0 %
Lymphocytes Absolute: 2.3 10*3/uL (ref 0.7–3.1)
Lymphs: 22 %
MCH: 31.3 pg (ref 26.6–33.0)
MCHC: 32.9 g/dL (ref 31.5–35.7)
MCV: 95 fL (ref 79–97)
Monocytes Absolute: 0.7 10*3/uL (ref 0.1–0.9)
Monocytes: 6 %
Neutrophils Absolute: 6.8 10*3/uL (ref 1.4–7.0)
Neutrophils: 67 %
Platelets: 274 10*3/uL (ref 150–450)
RBC: 4.25 x10E6/uL (ref 3.77–5.28)
RDW: 13 % (ref 11.7–15.4)
RPR Ser Ql: NONREACTIVE
Rh Factor: POSITIVE
Rubella Antibodies, IGG: 1.69 {index} (ref >0.99)
WBC: 10.2 10*3/uL (ref 3.4–10.8)

## 2023-08-26 LAB — CERVICOVAGINAL ANCILLARY ONLY
Bacterial Vaginitis (gardnerella): NEGATIVE
Candida Glabrata: NEGATIVE
Candida Vaginitis: POSITIVE — AB
Chlamydia: NEGATIVE
Comment: NEGATIVE
Comment: NEGATIVE
Comment: NEGATIVE
Comment: NEGATIVE
Comment: NEGATIVE
Comment: NORMAL
Neisseria Gonorrhea: NEGATIVE
Trichomonas: NEGATIVE

## 2023-08-26 LAB — HCV INTERPRETATION

## 2023-08-26 LAB — HEMOGLOBIN A1C
Est. average glucose Bld gHb Est-mCnc: 114 mg/dL
Hgb A1c MFr Bld: 5.6 % (ref 4.8–5.6)

## 2023-08-27 LAB — CYTOLOGY - PAP
Comment: NEGATIVE
Comment: NEGATIVE
Comment: NEGATIVE
Diagnosis: UNDETERMINED — AB
HPV 16: NEGATIVE
HPV 18 / 45: NEGATIVE
High risk HPV: POSITIVE — AB

## 2023-08-27 LAB — CULTURE, OB URINE

## 2023-08-27 LAB — URINE CULTURE, OB REFLEX

## 2023-09-01 LAB — PANORAMA PRENATAL TEST FULL PANEL:PANORAMA TEST PLUS 5 ADDITIONAL MICRODELETIONS: FETAL FRACTION: 5.2

## 2023-09-04 LAB — HORIZON CUSTOM: REPORT SUMMARY: NEGATIVE

## 2023-09-16 NOTE — L&D Delivery Note (Addendum)
 Delivery Note Pushed well to SVD.  At 10:04 PM a viable and healthy female was delivered via Vaginal, Spontaneous (Presentation:OA ).  APGAR: , ; weight  .   Placenta status: Spontaneous, Intact.  Cord: 3 vessels with the following complications: None.  Cord pH: 7.38 Venous There was a 1 minute shoulder dystocia Attempted delivery of anterior shoulder (left) without movement Able to grasp posterior axilla but could not deliver immediately so attention turned to anterior shoulder. McRoberts employed throughout I was able to grasp anterior axilla with movement of shoulder under symphysis pubis, then delivered posterior Baby stunned for a few seconds then became more reactive, cried  Anesthesia: Epidural Episiotomy: None Lacerations: None, small abrasion right vaginal sidewall, hemostatic Suture Repair: none Est. Blood Loss (mL):    Mom to postpartum.  Baby to Couplet care / Skin to Skin.  Discussed BTL  States she is 100% sure she wants this done.  Holmes Lusher 03/01/2024, 10:29 PM

## 2023-09-22 ENCOUNTER — Encounter: Payer: Self-pay | Admitting: Advanced Practice Midwife

## 2023-09-22 ENCOUNTER — Ambulatory Visit (INDEPENDENT_AMBULATORY_CARE_PROVIDER_SITE_OTHER): Payer: Medicaid Other | Admitting: Advanced Practice Midwife

## 2023-09-22 VITALS — BP 119/68 | Wt 204.6 lb

## 2023-09-22 DIAGNOSIS — N760 Acute vaginitis: Secondary | ICD-10-CM

## 2023-09-22 DIAGNOSIS — R0981 Nasal congestion: Secondary | ICD-10-CM

## 2023-09-22 DIAGNOSIS — O99891 Other specified diseases and conditions complicating pregnancy: Secondary | ICD-10-CM

## 2023-09-22 DIAGNOSIS — O99512 Diseases of the respiratory system complicating pregnancy, second trimester: Secondary | ICD-10-CM

## 2023-09-22 DIAGNOSIS — R42 Dizziness and giddiness: Secondary | ICD-10-CM

## 2023-09-22 DIAGNOSIS — O0992 Supervision of high risk pregnancy, unspecified, second trimester: Secondary | ICD-10-CM

## 2023-09-22 DIAGNOSIS — Z3A16 16 weeks gestation of pregnancy: Secondary | ICD-10-CM

## 2023-09-22 DIAGNOSIS — J45909 Unspecified asthma, uncomplicated: Secondary | ICD-10-CM

## 2023-09-22 DIAGNOSIS — Z8632 Personal history of gestational diabetes: Secondary | ICD-10-CM

## 2023-09-22 DIAGNOSIS — O09299 Supervision of pregnancy with other poor reproductive or obstetric history, unspecified trimester: Secondary | ICD-10-CM

## 2023-09-22 DIAGNOSIS — O99210 Obesity complicating pregnancy, unspecified trimester: Secondary | ICD-10-CM

## 2023-09-22 DIAGNOSIS — O09892 Supervision of other high risk pregnancies, second trimester: Secondary | ICD-10-CM

## 2023-09-22 MED ORDER — ALBUTEROL SULFATE HFA 108 (90 BASE) MCG/ACT IN AERS: 2.0000 | INHALATION_SPRAY | Freq: Four times a day (QID) | RESPIRATORY_TRACT | 2 refills | Status: DC | PRN

## 2023-09-22 MED ORDER — FLUTICASONE PROPIONATE 50 MCG/ACT NA SUSP: 2.0000 | Freq: Every day | NASAL | 1 refills | Status: DC

## 2023-09-22 MED ORDER — NYSTATIN-TRIAMCINOLONE 100000-0.1 UNIT/GM-% EX OINT: 1.0000 | TOPICAL_OINTMENT | Freq: Two times a day (BID) | CUTANEOUS | 0 refills | Status: DC

## 2023-09-22 MED ORDER — MONTELUKAST SODIUM 10 MG PO TABS: 10.0000 mg | ORAL_TABLET | Freq: Every day | ORAL | 3 refills | Status: DC

## 2023-09-22 MED ORDER — METHYLPREDNISOLONE 4 MG PO TBPK: ORAL_TABLET | ORAL | 0 refills | Status: DC

## 2023-09-22 NOTE — Progress Notes (Signed)
 Pt presents for ROB visit. Pt c/o lower abd cramping and rash in the vaginal area for 3 days. Did not receive treatment for yeast on 08/25/23. Pt also c/o headaches and dizziness. Pt reports chest tightness and wheezy not alleviated with breathing treatments at home.

## 2023-09-22 NOTE — Progress Notes (Signed)
 PRENATAL VISIT NOTE  Subjective:  Charlotte Cohen is a 23 y.o. G2P0101 at [redacted]w[redacted]d being seen today for ongoing prenatal care.  She is currently monitored for the following issues for this high-risk pregnancy and has Asthma; ASCUS with positive high risk HPV cervical; Gestational diabetes mellitus (GDM) affecting first pregnancy; Indication for care in labor or delivery; Supervision of high risk pregnancy, antepartum; Iron deficiency anemia secondary to inadequate dietary iron intake; Maternal varicella, non-immune; Enlarged ovary; and Macrosomia on their problem list.  Patient reports  concerns regarding her existing asthma . She has been SOB, Wheezing, and c/o Nasal congestion . Patient states she did not have a rescue inhaler and was not awre she had refills of her Singulair .  .  Contractions: Not present. Vag. Bleeding: None.  Movement: Absent. Denies leaking of fluid.   The following portions of the patient's history were reviewed and updated as appropriate: allergies, current medications, past family history, past medical history, past social history, past surgical history and problem list.   Objective:   Vitals:   09/22/23 1314  BP: 119/68  Weight: 204 lb 9.6 oz (92.8 kg)    Fetal Status: Fetal Heart Rate (bpm): 145 Fundal Height: 17 cm Movement: Absent     General:  Alert, oriented and cooperative. Patient is in no acute distress.  Skin: Skin is warm and dry. No rash noted.   Cardiovascular: Normal heart rate noted  Respiratory: Wheezing noted in right upper lobe, labored on exertion  Abdomen: Soft, gravid, appropriate for gestational age.  Pain/Pressure: Present     Pelvic: Cervical exam deferred       B/L Vulvovaginitis noted with erythema on inner thighs and external perineum  only   Extremities: Normal range of motion.  Edema: None  Mental Status: Normal mood and affect. Normal behavior. Normal judgment and thought content.   Assessment and Plan:  Pregnancy:  G2P0101 at [redacted]w[redacted]d   1. Dizziness  - AFP, Serum, Open Spina Bifida - CBC - Basic metabolic panel - Hemoglobin A1c  2. Vulvovaginitis  - nystatin -triamcinolone  ointment (MYCOLOG); Apply 1 Application topically 2 (two) times daily.  Dispense: 30 g; Refill: 0  3. Asthma affecting pregnancy in second trimester  - albuterol  (VENTOLIN  HFA) 108 (90 Base) MCG/ACT inhaler; Inhale 2 puffs into the lungs every 6 (six) hours as needed for wheezing or shortness of breath.  Dispense: 8 g; Refill: 2 - methylPREDNISolone  (MEDROL  DOSEPAK) 4 MG TBPK tablet; Please follow directions  Dispense: 1 each; Refill: 0 - montelukast  (SINGULAIR ) 10 MG tablet; Take 1 tablet (10 mg total) by mouth at bedtime.  Dispense: 30 tablet; Refill: 3  4. Nasal congestion related to pregnancy  - fluticasone  (FLONASE ) 50 MCG/ACT nasal spray; Place 2 sprays into both nostrils daily.  Dispense: 1 g; Refill: 1 - montelukast  (SINGULAIR ) 10 MG tablet; Take 1 tablet (10 mg total) by mouth at bedtime.  Dispense: 30 tablet; Refill: 3  5. [redacted] weeks gestation of pregnancy (Primary)  - AFP, Serum, Open Spina Bifida - Anatomy Sono scheduled 1/28  6. History of gestational diabetes in prior pregnancy, currently pregnant - Hemoglobin A1c  7. Encounter for supervision of high risk pregnancy in second trimester, antepartum   8. Obesity in pregnancy - BMI 36  9. Short interval between pregnancies affecting pregnancy in second trimester, antepartum  - Desires BTL [ ]  Papers briefly d/w patient    General obstetric precautions including but not limited to vaginal bleeding, contractions, leaking of fluid and fetal movement  were reviewed in detail with the patient. Please refer to After Visit Summary for other counseling recommendations.     Future Appointments  Date Time Provider Department Center  10/13/2023  1:15 PM [facility] NURSE [facility]  10/13/2023  1:30 PM [facility] US5 [facility]  10/20/2023  8:55 AM Starla Harland BROCKS, MD  [facility] None   Olam Dalton, MSN, [facility], Center for [employer]

## 2023-09-23 LAB — CBC
Hematocrit: 40.5 % (ref 34.0–46.6)
Hemoglobin: 13.5 g/dL (ref 11.1–15.9)
MCH: 31.5 pg (ref 26.6–33.0)
MCHC: 33.3 g/dL (ref 31.5–35.7)
MCV: 95 fL (ref 79–97)
Platelets: 259 10*3/uL (ref 150–450)
RBC: 4.28 x10E6/uL (ref 3.77–5.28)
RDW: 13.1 % (ref 11.7–15.4)
WBC: 13.3 10*3/uL — ABNORMAL HIGH (ref 3.4–10.8)

## 2023-09-23 LAB — BASIC METABOLIC PANEL (7)
BUN/Creatinine Ratio: 13 (ref 9–23)
BUN: 7 mg/dL (ref 6–20)
CO2: 22 mmol/L (ref 20–29)
Chloride: 102 mmol/L (ref 96–106)
Creatinine, Ser: 0.52 mg/dL — ABNORMAL LOW (ref 0.57–1.00)
Glucose: 69 mg/dL — ABNORMAL LOW (ref 70–99)
Potassium: 4.3 mmol/L (ref 3.5–5.2)
Sodium: 137 mmol/L (ref 134–144)
eGFR: 134 mL/min/{1.73_m2} (ref >59)

## 2023-09-23 LAB — HEMOGLOBIN A1C
Est. average glucose Bld gHb Est-mCnc: 111 mg/dL
Hgb A1c MFr Bld: 5.5 % (ref 4.8–5.6)

## 2023-09-24 LAB — AFP, SERUM, OPEN SPINA BIFIDA
AFP MoM: 0.79
AFP Value: 21.7 ng/mL
Gest. Age on Collection Date: 16 wk
Maternal Age At EDD: 23.8 a
OSBR Risk 1 IN: 10000
Test Results:: NEGATIVE
Weight: 204 [lb_av]

## 2023-10-06 DIAGNOSIS — O09299 Supervision of pregnancy with other poor reproductive or obstetric history, unspecified trimester: Secondary | ICD-10-CM | POA: Insufficient documentation

## 2023-10-06 DIAGNOSIS — Z8632 Personal history of gestational diabetes: Secondary | ICD-10-CM | POA: Insufficient documentation

## 2023-10-06 DIAGNOSIS — O99210 Obesity complicating pregnancy, unspecified trimester: Secondary | ICD-10-CM | POA: Insufficient documentation

## 2023-10-13 ENCOUNTER — Other Ambulatory Visit: Payer: Self-pay

## 2023-10-13 ENCOUNTER — Encounter: Payer: Self-pay | Admitting: *Deleted

## 2023-10-13 ENCOUNTER — Ambulatory Visit: Payer: Medicaid Other | Admitting: *Deleted

## 2023-10-13 ENCOUNTER — Other Ambulatory Visit: Payer: Self-pay | Admitting: *Deleted

## 2023-10-13 ENCOUNTER — Ambulatory Visit: Payer: Medicaid Other | Admitting: Obstetrics

## 2023-10-13 ENCOUNTER — Ambulatory Visit: Payer: Medicaid Other | Attending: Obstetrics & Gynecology

## 2023-10-13 VITALS — BP 114/53 | HR 79

## 2023-10-13 DIAGNOSIS — J45909 Unspecified asthma, uncomplicated: Secondary | ICD-10-CM

## 2023-10-13 DIAGNOSIS — E669 Obesity, unspecified: Secondary | ICD-10-CM

## 2023-10-13 DIAGNOSIS — O0990 Supervision of high risk pregnancy, unspecified, unspecified trimester: Secondary | ICD-10-CM

## 2023-10-13 DIAGNOSIS — O99210 Obesity complicating pregnancy, unspecified trimester: Secondary | ICD-10-CM

## 2023-10-13 DIAGNOSIS — Z8632 Personal history of gestational diabetes: Secondary | ICD-10-CM | POA: Diagnosis not present

## 2023-10-13 DIAGNOSIS — Z3A19 19 weeks gestation of pregnancy: Secondary | ICD-10-CM

## 2023-10-13 DIAGNOSIS — O99212 Obesity complicating pregnancy, second trimester: Secondary | ICD-10-CM

## 2023-10-13 DIAGNOSIS — O99512 Diseases of the respiratory system complicating pregnancy, second trimester: Secondary | ICD-10-CM | POA: Diagnosis not present

## 2023-10-13 DIAGNOSIS — O09292 Supervision of pregnancy with other poor reproductive or obstetric history, second trimester: Secondary | ICD-10-CM

## 2023-10-13 DIAGNOSIS — O09299 Supervision of pregnancy with other poor reproductive or obstetric history, unspecified trimester: Secondary | ICD-10-CM | POA: Diagnosis not present

## 2023-10-13 NOTE — Progress Notes (Signed)
MFM Consult Note  Charlotte Cohen is currently at 19 weeks and 0-days.  She was seen for a detailed fetal anatomy scan due to maternal obesity with a BMI of 34 and short interval pregnancy.    Her last pregnancy in May 2024 was complicated by diet-controlled gestational diabetes.  She delivered at 36+ weeks following PPROM.  Her most recent hemoglobin A1c was 5.5%.  She denies any problems in her current pregnancy.    She had a cell free DNA test earlier in her pregnancy which indicated a low risk for trisomy 60, 63, and 13. A female fetus is predicted.   She was informed that the fetal growth and amniotic fluid level were appropriate for her gestational age.   There were no obvious fetal anomalies noted on today's ultrasound exam.  However, today's exam was limited due to the fetal position.  The patient was informed that anomalies may be missed due to technical limitations. If the fetus is in a suboptimal position or maternal habitus is increased, visualization of the fetus in the maternal uterus may be impaired.  Due to maternal obesity, a follow-up exam was scheduled in 5 weeks.    The patient and her partner stated that all of her questions were answered today.  A total of 30 minutes was spent counseling and coordinating the care for this patient.  Greater than 50% of the time was spent in direct face-to-face contact.

## 2023-10-16 ENCOUNTER — Other Ambulatory Visit: Payer: Self-pay | Admitting: Nurse Practitioner

## 2023-10-16 DIAGNOSIS — O99891 Other specified diseases and conditions complicating pregnancy: Secondary | ICD-10-CM

## 2023-10-18 ENCOUNTER — Other Ambulatory Visit: Payer: Self-pay | Admitting: Obstetrics & Gynecology

## 2023-10-18 DIAGNOSIS — J309 Allergic rhinitis, unspecified: Secondary | ICD-10-CM

## 2023-10-19 ENCOUNTER — Inpatient Hospital Stay (HOSPITAL_COMMUNITY): Payer: Medicaid Other

## 2023-10-19 ENCOUNTER — Inpatient Hospital Stay (HOSPITAL_COMMUNITY)
Admission: AD | Admit: 2023-10-19 | Discharge: 2023-10-19 | Discharge status: Against Medical Advice | Payer: Medicaid Other | Attending: Obstetrics and Gynecology | Admitting: Obstetrics and Gynecology

## 2023-10-19 ENCOUNTER — Other Ambulatory Visit: Payer: Self-pay | Admitting: Family Medicine

## 2023-10-19 DIAGNOSIS — J309 Allergic rhinitis, unspecified: Secondary | ICD-10-CM

## 2023-10-19 DIAGNOSIS — O99512 Diseases of the respiratory system complicating pregnancy, second trimester: Secondary | ICD-10-CM | POA: Insufficient documentation

## 2023-10-19 DIAGNOSIS — O26892 Other specified pregnancy related conditions, second trimester: Secondary | ICD-10-CM

## 2023-10-19 DIAGNOSIS — Z3A19 19 weeks gestation of pregnancy: Secondary | ICD-10-CM | POA: Diagnosis not present

## 2023-10-19 DIAGNOSIS — O0990 Supervision of high risk pregnancy, unspecified, unspecified trimester: Secondary | ICD-10-CM

## 2023-10-19 DIAGNOSIS — R0602 Shortness of breath: Secondary | ICD-10-CM | POA: Diagnosis not present

## 2023-10-19 DIAGNOSIS — J45909 Unspecified asthma, uncomplicated: Secondary | ICD-10-CM

## 2023-10-19 DIAGNOSIS — J45901 Unspecified asthma with (acute) exacerbation: Secondary | ICD-10-CM | POA: Diagnosis not present

## 2023-10-19 LAB — RESP PANEL BY RT-PCR (RSV, FLU A&B, COVID)  RVPGX2
Influenza A by PCR: NEGATIVE
Influenza B by PCR: NEGATIVE
Resp Syncytial Virus by PCR: NEGATIVE
SARS Coronavirus 2 by RT PCR: NEGATIVE

## 2023-10-19 MED ORDER — ALBUTEROL SULFATE (2.5 MG/3ML) 0.083% IN NEBU: 2.5000 mg | INHALATION_SOLUTION | RESPIRATORY_TRACT | 3 refills | Status: DC | PRN

## 2023-10-19 MED ORDER — ALBUTEROL SULFATE (2.5 MG/3ML) 0.083% IN NEBU
2.5000 mg | INHALATION_SOLUTION | Freq: Once | RESPIRATORY_TRACT | Status: AC
Start: 2023-10-19 — End: 2023-10-19
  Administered 2023-10-19: 2.5 mg via RESPIRATORY_TRACT
  Filled 2023-10-19: qty 3

## 2023-10-19 MED ORDER — METHYLPREDNISOLONE SODIUM SUCC 125 MG IJ SOLR: 40.0000 mg | Freq: Once | INTRAMUSCULAR | Status: DC

## 2023-10-19 MED ORDER — IPRATROPIUM-ALBUTEROL 0.5-2.5 (3) MG/3ML IN SOLN
RESPIRATORY_TRACT | Status: AC
  Administered 2023-10-19: 3 mL via RESPIRATORY_TRACT
  Filled 2023-10-19: qty 3

## 2023-10-19 MED ORDER — BUDESONIDE 180 MCG/ACT IN AEPB: 1.0000 | INHALATION_SPRAY | Freq: Two times a day (BID) | RESPIRATORY_TRACT | 2 refills | Status: DC

## 2023-10-19 MED ORDER — IPRATROPIUM-ALBUTEROL 0.5-2.5 (3) MG/3ML IN SOLN: 3.0000 mL | Freq: Once | RESPIRATORY_TRACT | Status: AC

## 2023-10-19 MED ORDER — PREDNISONE 50 MG PO TABS
60.0000 mg | ORAL_TABLET | Freq: Once | ORAL | Status: AC
  Administered 2023-10-19: 60 mg via ORAL
  Filled 2023-10-19: qty 1

## 2023-10-19 MED ORDER — PREDNISONE 10 MG PO TABS: ORAL_TABLET | ORAL | 0 refills | Status: AC

## 2023-10-19 MED ORDER — BUDESONIDE 180 MCG/ACT IN AEPB
1.0000 | INHALATION_SPRAY | Freq: Once | RESPIRATORY_TRACT | Status: AC
Start: 2023-10-19 — End: 2023-10-19
  Administered 2023-10-19: 1 via RESPIRATORY_TRACT
  Filled 2023-10-19: qty 1

## 2023-10-19 MED ORDER — ALBUTEROL SULFATE HFA 108 (90 BASE) MCG/ACT IN AERS: 2.0000 | INHALATION_SPRAY | Freq: Four times a day (QID) | RESPIRATORY_TRACT | 2 refills | Status: DC | PRN

## 2023-10-19 MED ORDER — ALBUTEROL SULFATE HFA 108 (90 BASE) MCG/ACT IN AERS
2.0000 | INHALATION_SPRAY | Freq: Once | RESPIRATORY_TRACT | Status: AC
Start: 2023-10-19 — End: 2023-10-19
  Administered 2023-10-19: 2 via RESPIRATORY_TRACT
  Filled 2023-10-19: qty 6.7

## 2023-10-19 NOTE — MAU Note (Signed)
Says has hx of asthma - has used meds given  2 weeks ago- says symptoms are worse

## 2023-10-19 NOTE — MAU Note (Signed)
Pt says she started feeling chest and lower back hurt when breathing - started at 10am- then  Worse at 4pm  Has H/A - bc of coughing - all started at 4pm  No meds taken  No fever at home  Noone else sick. [facility]

## 2023-10-19 NOTE — MAU Note (Signed)
Patient came to nurses station stating she needed to leave. This RN went into room and discussed with patient that the provider was pending a consult with the hospitalist due to her SpO2 being 90%. The patient stated that she needed to go home because of child care.  Provider notified and stated she needed to sign out AMA.  AMA form signed and patient ambulated out of room with support person.

## 2023-10-19 NOTE — MAU Note (Signed)
Called pt from lobby - not there- says radiology can and took her to Xray.

## 2023-10-19 NOTE — MAU Provider Note (Addendum)
History     CSN: [id number]  Arrival date and time: 10/19/23 1850   Event Date/Time   First Provider Initiated Contact with Patient    Chief Complaint  Patient presents with   Shortness of Breath    HPI  Charlotte Cohen is a 23 y.o. G2P0101 at [redacted]w[redacted]d who presents to the MAU for shortness of breath. Reports hx asthma -- has been having shortness of breath and wheezing since early January. She was seen by her primary OB who prescribed a Medrol Dosepak, Singulair, and an Albuterol inhaler. She states symptoms did not improve with these measures. Reports SOB and coughing started again around 10am today, worsened around 4pm. Now has headache due to how much she has been coughing. Initially stated no meds, then later reports use of Albuterol inhaler earlier today. No sick contacts. No fevers, chills, nausea, vomiting, CP.  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   WISDOM TOOTH EXTRACTION  2021   four;    Family History  Problem Relation Age of Onset   Depression Mother    Anxiety disorder Mother    Healthy Mother    Bipolar disorder Mother    Bipolar disorder Father    Depression Father    Anxiety disorder Father    Healthy Father    Healthy Sister    Healthy Sister    Healthy Brother    Healthy Brother    Healthy Brother    Eczema Brother    Cancer Maternal Grandmother        vaginal/uterine   Eczema Maternal Grandfather    Eczema Paternal Grandmother    Eczema Paternal Grandfather     Social History   Tobacco Use   Smoking status: Never   Smokeless tobacco: Never  Vaping Use   Vaping status: Never Used  Substance Use Topics   Alcohol use: Never   Drug use: Never    Allergies: No Known Allergies  Medications Prior to Admission  Medication Sig Dispense Refill Last Dose/Taking    acetaminophen (TYLENOL) 325 MG tablet Take 2 tablets (650 mg total) by mouth every 4 (four) hours as needed (for pain scale < 4). 30 tablet 1 10/13/2023   albuterol (PROVENTIL) (2.5 MG/3ML) 0.083% nebulizer solution Take 3 mLs (2.5 mg total) by nebulization every 6 (six) hours as needed for wheezing or shortness of breath. 75 mL 12 10/18/2023 at 11:00 PM   albuterol (VENTOLIN HFA) 108 (90 Base) MCG/ACT inhaler Inhale 2 puffs into the lungs every 6 (six) hours as needed for wheezing or shortness of breath. 8 g 2 10/19/2023   Blood Pressure Monitoring (BLOOD PRESSURE KIT) DEVI 1 Device by Does not apply route once a week. 1 each 0    cetirizine (ZYRTEC) 10 MG tablet Take 1 tablet (10 mg total) by mouth daily. 90 tablet 1 10/05/2023   fluticasone (FLONASE) 50 MCG/ACT nasal spray Place 2 sprays into both nostrils daily. 16 g 2 10/19/2023   fluticasone (FLONASE) 50 MCG/ACT nasal spray SPRAY 2 SPRAYS INTO EACH NOSTRIL EVERY DAY 48 mL 1 10/19/2023   methylPREDNISolone (MEDROL DOSEPAK) 4 MG TBPK tablet Please follow directions 1 each 0 10/05/2023   montelukast (SINGULAIR) 10 MG tablet Take 1 tablet (10 mg total) by mouth at bedtime. 30 tablet  3 10/05/2023   nystatin-triamcinolone ointment (MYCOLOG) Apply 1 Application topically 2 (two) times daily. 30 g 0    oxyCODONE (OXY IR/ROXICODONE) 5 MG immediate release tablet Take 1 tablet (5 mg total) by mouth every 4 (four) hours as needed for severe pain or moderate pain. (Patient not taking: Reported on 09/22/2023) 5 tablet 0 More than a month   Prenatal Vit-Fe Fumarate-FA (PRENATAL PLUS VITAMIN/MINERAL) 27-1 MG TABS Take 1 tablet by mouth daily. 30 tablet 12 10/19/2023    ROS reviewed and pertinent positives and negatives as documented in HPI.  Physical Exam   Blood pressure 122/62, pulse 83, temperature 98.2 F (36.8 C), temperature source Oral, resp. rate 16, height 5\' 3"  (1.6 m), weight 92.6 kg, last menstrual period 05/22/2023, SpO2 93%, unknown if currently  breastfeeding.  Physical Exam Constitutional:      General: She is not in acute distress.    Appearance: Normal appearance.  HENT:     Head: Normocephalic and atraumatic.  Cardiovascular:     Rate and Rhythm: Normal rate and regular rhythm.     Heart sounds: Normal heart sounds.  Pulmonary:     Effort: Pulmonary effort is normal. No respiratory distress.     Breath sounds: Examination of the right-upper field reveals wheezing. Examination of the left-upper field reveals wheezing. Examination of the right-middle field reveals wheezing. Examination of the left-middle field reveals wheezing. Examination of the right-lower field reveals wheezing. Examination of the left-lower field reveals wheezing. Wheezing present. No decreased breath sounds.  Abdominal:     General: There is no distension.     Palpations: Abdomen is soft.     Tenderness: There is no abdominal tenderness. There is no right CVA tenderness or left CVA tenderness.  Musculoskeletal:        General: Normal range of motion.  Skin:    General: Skin is warm and dry.     Findings: No rash.  Neurological:     General: No focal deficit present.     Mental Status: She is alert and oriented to person, place, and time.     MAU Course  Procedures  MDM 23 y.o. G2P0101 at [redacted]w[redacted]d presenting for asthma exacerbation. Pt with known asthma, not better with PRN inhaler. On exam here, she has diffuse wheezing throughout all lung fields, otherwise benign exam. Will get COVID/flu/RSV swab. CXR obtained and negative. Will order Pulmicort for bid use, Albuterol, Prednisone. Suspect will need longer steroid taper.   9:31 PM  Pt re-evaluated -- still with significant wheezing of b/l lung fields. Will order nebulizer tx.   10:01 PM  Patient receiving nebs now. Per RT, pt has more air movement now, rec round of DuoNebs. Will put in IM Solumedrol. Pt signed out to Wynelle Bourgeois, CNM.   Sundra Aland, MD OB Fellow, Faculty Practice [facility], Center for [facility]   Assessment and Plan     Sundra Aland, MD OB Fellow, Faculty Practice [facility], Center for [facility]  10/19/2023, 8:36 PM    Did not feel much better after Nebulizer treatment Family Medicine consulted for possible admission Informed by RN that patient decided to leave AMA to attend to family matters at home Dr Lucianne Muss had already sent steroid taper in to pharmacy  Aviva Signs, CNM

## 2023-10-19 NOTE — Telephone Encounter (Signed)
Copied from CRM [id number]. Topic: Clinical - Medication Refill >> Oct 19, 2023 10:37 AM Dimitri Ped wrote: Most Recent Primary Care Visit:  Provider: Georganna Skeans  Department: [facility]  Visit Type: NEW PATIENT  Date: 05/15/2021  Medication: albuterol (PROVENTIL) (2.5 MG/3ML) 0.083% nebulizer solution cetirizine (ZYRTEC) 10 MG tablet    Has the patient contacted their pharmacy? Yes they said they was waiting for the doctor (Agent: If no, request that the patient contact the pharmacy for the refill. If patient does not wish to contact the pharmacy document the reason why and proceed with request.) (Agent: If yes, when and what did the pharmacy advise?)  Is this the correct pharmacy for this prescription? Yes If no, delete pharmacy and type the correct one.  This is the patient's preferred pharmacy:  [id number] [place], [place] - [address] [place] [address] Phone: [phone] Fax: [phone]  [facility] - [place], [place] - [address] [place] [address] Phone: [phone] Fax: [phone]   Has the prescription been filled recently? No  Is the patient out of the medication? Yes  Has the patient been seen for an appointment in the last year OR does the patient have an upcoming appointment? No  Can we respond through MyChart? Yes  Agent: Please be advised that Rx refills may take up to 3 business days. We ask that you follow-up with your pharmacy.

## 2023-10-20 ENCOUNTER — Other Ambulatory Visit (HOSPITAL_COMMUNITY)
Admission: RE | Admit: 2023-10-20 | Discharge: 2023-10-20 | Disposition: A | Discharge status: Home / Self Care | Payer: Medicaid Other | Source: Ambulatory Visit | Attending: Obstetrics & Gynecology | Admitting: Obstetrics & Gynecology

## 2023-10-20 ENCOUNTER — Ambulatory Visit (INDEPENDENT_AMBULATORY_CARE_PROVIDER_SITE_OTHER): Payer: Medicaid Other | Admitting: Obstetrics & Gynecology

## 2023-10-20 VITALS — BP 109/71 | HR 110 | Wt 205.0 lb

## 2023-10-20 DIAGNOSIS — R87810 Cervical high risk human papillomavirus (HPV) DNA test positive: Secondary | ICD-10-CM

## 2023-10-20 DIAGNOSIS — R87610 Atypical squamous cells of undetermined significance on cytologic smear of cervix (ASC-US): Secondary | ICD-10-CM

## 2023-10-20 DIAGNOSIS — O99210 Obesity complicating pregnancy, unspecified trimester: Secondary | ICD-10-CM

## 2023-10-20 DIAGNOSIS — Z8632 Personal history of gestational diabetes: Secondary | ICD-10-CM

## 2023-10-20 DIAGNOSIS — N898 Other specified noninflammatory disorders of vagina: Secondary | ICD-10-CM | POA: Diagnosis not present

## 2023-10-20 DIAGNOSIS — O0990 Supervision of high risk pregnancy, unspecified, unspecified trimester: Secondary | ICD-10-CM

## 2023-10-20 MED ORDER — METRONIDAZOLE 500 MG PO TABS: 500.0000 mg | ORAL_TABLET | Freq: Two times a day (BID) | ORAL | 0 refills | Status: DC

## 2023-10-20 MED ORDER — TERCONAZOLE 0.4 % VA CREA: 1.0000 | TOPICAL_CREAM | Freq: Every day | VAGINAL | 0 refills | Status: AC

## 2023-10-20 MED ORDER — ALBUTEROL SULFATE (2.5 MG/3ML) 0.083% IN NEBU: 2.5000 mg | INHALATION_SOLUTION | Freq: Four times a day (QID) | RESPIRATORY_TRACT | 12 refills | Status: DC | PRN

## 2023-10-20 MED ORDER — CETIRIZINE HCL 10 MG PO TABS: 10.0000 mg | ORAL_TABLET | Freq: Every day | ORAL | 11 refills | Status: AC

## 2023-10-20 NOTE — Progress Notes (Signed)
 Pt presents for ROB. No questions or concerns.

## 2023-10-20 NOTE — Progress Notes (Signed)
   PRENATAL VISIT NOTE  Subjective:  Charlotte Cohen is a 23 y.o. G79P6177 (59 month old) at [redacted]w[redacted]d being seen today for ongoing prenatal care.  She is currently monitored for the following issues for this high-risk pregnancy and has Asthma; ASCUS with positive high risk HPV cervical; Supervision of high risk pregnancy, antepartum; Maternal varicella, non-immune; Enlarged ovary; History of gestational diabetes; History of macrosomia in infant in prior pregnancy, currently pregnant; and Obesity affecting pregnancy on their problem list.  Patient reports  vaginal itching .  Contractions: Not present. Vag. Bleeding: None.   . Denies leaking of fluid.   She was seen at MAU last night with SOB/asthma exaccerbation. She was given a steroid dose pack which she plans to pick up today. She is using her nebulizer and feels some better. Her CXR was benign.  The following portions of the patient's history were reviewed and updated as appropriate: allergies, current medications, past family history, past medical history, past social history, past surgical history and problem list.   Objective:   Vitals:   10/20/23 0908  BP: 109/71  Pulse: (!) 110  Weight: 205 lb (93 kg)    Fetal Status: Fetal Heart Rate (bpm): 159 Fundal Height: 20 cm       General:  Alert, oriented and cooperative. Patient is in no acute distress.  Skin: Skin is warm and dry. No rash noted.   Cardiovascular: Normal heart rate noted  Respiratory: Normal respiratory effort, no problems with respiration noted, wheezes noted on auscultation  Abdomen: Soft, gravid, appropriate for gestational age.  Pain/Pressure: Absent     Pelvic:  Pederson spec used, frothy vaginal discharge noted  Extremities: Normal range of motion.  Edema: None  Mental Status: Normal mood and affect. Normal behavior. Normal judgment and thought content.   Assessment and Plan:  Pregnancy: G2P0101 at [redacted]w[redacted]d 1. ASCUS with positive high risk HPV cervical  (Primary) - repeat pap postpartum  2. Supervision of high risk pregnancy, antepartum   3. History of gestational diabetes - early glucola normal  4. Obesity affecting pregnancy, antepartum, unspecified obesity type  - TSH + free T4 - Protein / creatinine ratio, urine  5. Vaginal itching - terazol prescribed in case yeast is positive, - flagyl  prescribed - Cervicovaginal ancillary only( [place])  6. Asthma- rec dose pack along with current meds  Preterm labor symptoms and general obstetric precautions including but not limited to vaginal bleeding, contractions, leaking of fluid and fetal movement were reviewed in detail with the patient. Please refer to After Visit Summary for other counseling recommendations.   Return in about 2 weeks (around 11/03/2023).  Future Appointments  Date Time Provider Department Center  11/17/2023  8:15 AM [facility] NURSE [facility]  11/17/2023  8:30 AM [facility] US2 [facility]    Harland JAYSON Birkenhead, MD

## 2023-10-21 LAB — CERVICOVAGINAL ANCILLARY ONLY
Bacterial Vaginitis (gardnerella): NEGATIVE
Candida Glabrata: NEGATIVE
Candida Vaginitis: POSITIVE — AB
Comment: NEGATIVE
Comment: NEGATIVE
Comment: NEGATIVE
Comment: NEGATIVE
Trichomonas: NEGATIVE

## 2023-10-26 ENCOUNTER — Encounter: Payer: Self-pay | Admitting: Obstetrics & Gynecology

## 2023-11-01 ENCOUNTER — Inpatient Hospital Stay (HOSPITAL_COMMUNITY)
Admission: AD | Admit: 2023-11-01 | Discharge: 2023-11-01 | Disposition: A | Discharge status: Home / Self Care | Payer: Medicaid Other | Attending: Obstetrics & Gynecology | Admitting: Obstetrics & Gynecology

## 2023-11-01 ENCOUNTER — Inpatient Hospital Stay (HOSPITAL_COMMUNITY): Payer: Medicaid Other

## 2023-11-01 DIAGNOSIS — J101 Influenza due to other identified influenza virus with other respiratory manifestations: Secondary | ICD-10-CM | POA: Insufficient documentation

## 2023-11-01 DIAGNOSIS — R058 Other specified cough: Secondary | ICD-10-CM | POA: Diagnosis not present

## 2023-11-01 DIAGNOSIS — R059 Cough, unspecified: Secondary | ICD-10-CM

## 2023-11-01 DIAGNOSIS — O26892 Other specified pregnancy related conditions, second trimester: Secondary | ICD-10-CM | POA: Diagnosis not present

## 2023-11-01 DIAGNOSIS — O99512 Diseases of the respiratory system complicating pregnancy, second trimester: Secondary | ICD-10-CM | POA: Diagnosis not present

## 2023-11-01 DIAGNOSIS — R6889 Other general symptoms and signs: Secondary | ICD-10-CM

## 2023-11-01 DIAGNOSIS — Z3A21 21 weeks gestation of pregnancy: Secondary | ICD-10-CM | POA: Insufficient documentation

## 2023-11-01 DIAGNOSIS — Z1152 Encounter for screening for COVID-19: Secondary | ICD-10-CM | POA: Insufficient documentation

## 2023-11-01 LAB — URINALYSIS, ROUTINE W REFLEX MICROSCOPIC
Bilirubin Urine: NEGATIVE
Glucose, UA: NEGATIVE mg/dL
Hgb urine dipstick: NEGATIVE
Ketones, ur: 5 mg/dL — AB
Leukocytes,Ua: NEGATIVE
Nitrite: NEGATIVE
Protein, ur: NEGATIVE mg/dL
Specific Gravity, Urine: 1.017 (ref 1.005–1.030)
pH: 6 (ref 5.0–8.0)

## 2023-11-01 LAB — GROUP A STREP BY PCR: Group A Strep by PCR: NOT DETECTED

## 2023-11-01 LAB — RESP PANEL BY RT-PCR (RSV, FLU A&B, COVID)  RVPGX2
Influenza A by PCR: POSITIVE — AB
Influenza B by PCR: NEGATIVE
Resp Syncytial Virus by PCR: NEGATIVE
SARS Coronavirus 2 by RT PCR: NEGATIVE

## 2023-11-01 MED ORDER — CYCLOBENZAPRINE HCL 5 MG PO TABS
10.0000 mg | ORAL_TABLET | Freq: Once | ORAL | Status: AC
  Administered 2023-11-01: 10 mg via ORAL
  Filled 2023-11-01: qty 2

## 2023-11-01 MED ORDER — ACETAMINOPHEN-CAFFEINE 500-65 MG PO TABS
2.0000 | ORAL_TABLET | Freq: Once | ORAL | Status: AC
  Administered 2023-11-01: 2 via ORAL
  Filled 2023-11-01: qty 2

## 2023-11-01 MED ORDER — ACETAMINOPHEN 500 MG PO TABS: 1000.0000 mg | ORAL_TABLET | Freq: Once | ORAL | Status: DC

## 2023-11-01 MED ORDER — DEXTROMETHORPHAN POLISTIREX ER 30 MG/5ML PO SUER
30.0000 mg | Freq: Once | ORAL | Status: AC
  Administered 2023-11-01: 30 mg via ORAL
  Filled 2023-11-01: qty 5

## 2023-11-01 NOTE — MAU Note (Signed)
.  Charlotte Cohen is a 23 y.o. at [redacted]w[redacted]d here in MAU reporting: started having symptoms of fever, cough congestion on Wednesday. Today still had cough has  chest pain and back pain with the coughing. C/o headache and vomiting today as well . Reports sore throat and right ear pain also. Has taken tylenol but has not helped with headache  LMP:  Onset of complaint: Wed Pain score: 6-8 Vitals:   11/01/23 2053  BP: 118/67  Pulse: 89  Resp: 18  Temp: 97.8 F (36.6 C)     FHT: 154  Lab orders placed from triage: covid,flu,RSV, Strep A, U/A

## 2023-11-01 NOTE — Discharge Instructions (Signed)

## 2023-11-01 NOTE — MAU Provider Note (Signed)
 Chief Complaint:  Sore Throat, Cough, and Emesis   HPI      Charlotte Cohen is a 23 y.o. G2P0101 at [redacted]w[redacted]d who presents to maternity admissions reporting that starting on 2/12  c/o fever ( per patient does not have a thermoter) Soar throat, Productive couph, sinus conjestion, b/l ear pain, chest and back pain with coughing and a severe HA.  She reported she only took Tylenol and no other OTC meds or cough medications and did not notify her Primary OB that she was feeling sick.   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   WISDOM TOOTH EXTRACTION  2021   four;   Family History  Problem Relation Age of Onset   Depression Mother    Anxiety disorder Mother    Healthy Mother    Bipolar disorder Mother    Bipolar disorder Father    Depression Father    Anxiety disorder Father    Healthy Father    Healthy Sister    Healthy Sister    Healthy Brother    Healthy Brother    Healthy Brother    Eczema Brother    Cancer Maternal Grandmother        vaginal/uterine   Eczema Maternal Grandfather    Eczema Paternal Grandmother    Eczema Paternal Grandfather    Social History   Tobacco Use   Smoking status: Never   Smokeless tobacco: Never  Vaping Use   Vaping status: Never Used  Substance Use Topics   Alcohol use: Never   Drug use: Never   No Known Allergies No medications prior to admission.    I have reviewed patient's Past Medical Hx,  Surgical Hx, Family Hx, Social Hx, medications and allergies.   ROS  Pertinent items noted in HPI and remainder of comprehensive ROS otherwise negative.   PHYSICAL EXAM  Patient Vitals for the past 24 hrs:  BP Temp Pulse Resp SpO2 Height Weight  11/01/23 2053 118/67 97.8 F (36.6 C) 89 18 96 % 5\' 3"  (1.6 m) 93.9 kg    Constitutional: Well-developed, obese female, afebrile, NAD Cardiovascular: normal rate & rhythm, warm and well-perfused Respiratory: normal effort, no problems with respiration noted, Lungs BCTA  GI: Abd soft, non-tender, gravid MS: Extremities nontender, no edema, normal ROM Neurologic: Alert and oriented x 4.  GU: no CVA tenderness Pelvic: deferred      Fetal Tracing: 154 by doppler   Labs: Results for orders placed or performed during the hospital encounter of 11/01/23 (from the past 24 hours)  Resp panel by RT-PCR (RSV, Flu A&B, Covid) Anterior Nasal Swab     Status: Abnormal   Collection Time: 11/01/23  9:04 PM   Specimen: Anterior Nasal Swab  Result Value  Ref Range   SARS Coronavirus 2 by RT PCR NEGATIVE NEGATIVE   Influenza A by PCR POSITIVE (A) NEGATIVE   Influenza B by PCR NEGATIVE NEGATIVE   Resp Syncytial Virus by PCR NEGATIVE NEGATIVE  Urinalysis, Routine w reflex microscopic -Urine, Clean Catch     Status: Abnormal   Collection Time: 11/01/23  9:04 PM  Result Value Ref Range   Color, Urine YELLOW YELLOW   APPearance HAZY (A) CLEAR   Specific Gravity, Urine 1.017 1.005 - 1.030   pH 6.0 5.0 - 8.0   Glucose, UA NEGATIVE NEGATIVE mg/dL   Hgb urine dipstick NEGATIVE NEGATIVE   Bilirubin Urine NEGATIVE NEGATIVE   Ketones, ur 5 (A) NEGATIVE mg/dL   Protein, ur NEGATIVE NEGATIVE mg/dL   Nitrite NEGATIVE NEGATIVE   Leukocytes,Ua NEGATIVE NEGATIVE  Group A Strep by PCR     Status: None   Collection Time: 11/01/23  9:04 PM   Specimen: Throat; Sterile Swab  Result Value Ref Range   Group A Strep by PCR NOT DETECTED NOT DETECTED    Imaging:  DG  CHEST PORT 1 VIEW Result Date: 11/01/2023 CLINICAL DATA:  Productive cough EXAM: PORTABLE CHEST 1 VIEW COMPARISON:  10/19/2023 FINDINGS: Heart and mediastinal contours are within normal limits. No focal opacities or effusions. No acute bony abnormality. IMPRESSION: No active cardiopulmonary disease. Electronically Signed   By: Charlett Nose M.D.   On: 11/01/2023 22:08    MDM & MAU COURSE  MDM: HIGH   Swabs obtained for RSV, COVID, Influenza, U/A CXR ordered  Airborne precautions ( N- 95 advised) Meds ordered as stated below  R/O acute pathology  Likely Viral flu like s/s ( Patient outside of treatment window >72 hours at onset of symptoms)   I have reviewed the patient chart and performed the physical exam . I have ordered & interpreted the lab results and reviewed and interpreted the results  Medications ordered as stated below.  A/P as described below.  Counseling and education provided and patient agreeable  with plan as described below. Verbalized understanding.    MAU Course: Orders Placed This Encounter  Procedures   Resp panel by RT-PCR (RSV, Flu A&B, Covid) Anterior Nasal Swab   Group A Strep by PCR   DG CHEST PORT 1 VIEW   Urinalysis, Routine w reflex microscopic -Urine, Clean Catch   Airborne and Contact precautions   Discharge patient Discharge disposition: 01-Home or Self Care; Discharge patient date: 11/01/2023   Meds ordered this encounter  Medications   acetaminophen-caffeine (EXCEDRIN TENSION HEADACHE) 500-65 MG per tablet 2 tablet   DISCONTD: acetaminophen (TYLENOL) tablet 1,000 mg   cyclobenzaprine (FLEXERIL) tablet 10 mg   dextromethorphan (DELSYM) 30 MG/5ML liquid 30 mg    ASSESSMENT   1. Influenza A   2. [redacted] weeks gestation of pregnancy   3. Cough, unspecified type   4. Flu-like symptoms     PLAN  Discharge home in stable condition with return precautions.  Outside of the treatment window for Tamiflu ( >72 hours since onset of s/s)  Patient  counseled and provided a list of safe medications to take OTC ( Please see AVS for full description of education and counseling provided) She verbalized understanding and agrees with management plan  OB f/u as scheduled once s/s resolve    Follow-up Information     [facility] for [facility] at [facility] Follow up.   Specialty: Obstetrics and Gynecology Why: If symptoms worsen or fail to resolve Contact  information: [address] [place] [address] [phone]                Allergies as of 11/01/2023   No Known Allergies      Medication List     STOP taking these medications    metroNIDAZOLE 500 MG tablet Commonly known as: FLAGYL       TAKE these medications    acetaminophen 325 MG tablet Commonly known as: Tylenol Take 2 tablets (650 mg total) by mouth every 4 (four) hours as needed (for pain scale < 4).   albuterol (2.5 MG/3ML) 0.083% nebulizer solution Commonly known as: PROVENTIL Take 3 mLs (2.5 mg total) by nebulization every 4 (four) hours as needed for wheezing or shortness of breath.   albuterol 108 (90 Base) MCG/ACT inhaler Commonly known as: VENTOLIN HFA Inhale 2 puffs into the lungs every 6 (six) hours as needed for wheezing or shortness of breath.   albuterol (2.5 MG/3ML) 0.083% nebulizer solution Commonly known as: PROVENTIL Take 3 mLs (2.5 mg total) by nebulization every 6 (six) hours as needed for wheezing or shortness of breath.   Blood Pressure Kit Devi 1 Device by Does not apply route once a week.   budesonide 180 MCG/ACT inhaler Commonly known as: PULMICORT Inhale 1 puff into the lungs 2 (two) times daily.   cetirizine 10 MG tablet Commonly known as: ZYRTEC Take 1 tablet (10 mg total) by mouth daily.   cetirizine 10 MG tablet Commonly known as: ZyrTEC Allergy Take 1 tablet (10 mg total) by mouth daily.   fluticasone 50 MCG/ACT nasal spray Commonly known as: FLONASE Place 2 sprays  into both nostrils daily.   montelukast 10 MG tablet Commonly known as: SINGULAIR Take 1 tablet (10 mg total) by mouth at bedtime.   Prenatal Plus Vitamin/Mineral 27-1 MG Tabs Take 1 tablet by mouth daily.        Marcell Barlow, MSN, [facility], Center for [employer]

## 2023-11-03 ENCOUNTER — Encounter: Payer: Medicaid Other | Admitting: Obstetrics & Gynecology

## 2023-11-17 ENCOUNTER — Ambulatory Visit: Payer: Medicaid Other | Attending: Obstetrics

## 2023-11-17 ENCOUNTER — Ambulatory Visit: Payer: Medicaid Other | Admitting: *Deleted

## 2023-11-17 ENCOUNTER — Other Ambulatory Visit: Payer: Self-pay | Admitting: *Deleted

## 2023-11-17 ENCOUNTER — Other Ambulatory Visit: Payer: Self-pay

## 2023-11-17 VITALS — BP 102/63 | HR 90

## 2023-11-17 DIAGNOSIS — Z3A24 24 weeks gestation of pregnancy: Secondary | ICD-10-CM | POA: Diagnosis not present

## 2023-11-17 DIAGNOSIS — O99513 Diseases of the respiratory system complicating pregnancy, third trimester: Secondary | ICD-10-CM | POA: Diagnosis not present

## 2023-11-17 DIAGNOSIS — O09292 Supervision of pregnancy with other poor reproductive or obstetric history, second trimester: Secondary | ICD-10-CM | POA: Diagnosis not present

## 2023-11-17 DIAGNOSIS — O0990 Supervision of high risk pregnancy, unspecified, unspecified trimester: Secondary | ICD-10-CM | POA: Insufficient documentation

## 2023-11-17 DIAGNOSIS — E669 Obesity, unspecified: Secondary | ICD-10-CM

## 2023-11-17 DIAGNOSIS — J45909 Unspecified asthma, uncomplicated: Secondary | ICD-10-CM | POA: Diagnosis not present

## 2023-11-17 DIAGNOSIS — O09299 Supervision of pregnancy with other poor reproductive or obstetric history, unspecified trimester: Secondary | ICD-10-CM

## 2023-11-17 DIAGNOSIS — O99212 Obesity complicating pregnancy, second trimester: Secondary | ICD-10-CM

## 2023-11-17 DIAGNOSIS — O09212 Supervision of pregnancy with history of pre-term labor, second trimester: Secondary | ICD-10-CM

## 2023-11-18 ENCOUNTER — Encounter: Admitting: Obstetrics and Gynecology

## 2023-11-27 ENCOUNTER — Inpatient Hospital Stay (HOSPITAL_COMMUNITY)
Admission: AD | Admit: 2023-11-27 | Discharge: 2023-11-27 | Disposition: A | Discharge status: Home / Self Care | Attending: Obstetrics and Gynecology | Admitting: Obstetrics and Gynecology

## 2023-11-27 ENCOUNTER — Encounter (HOSPITAL_COMMUNITY): Payer: Self-pay | Admitting: Obstetrics and Gynecology

## 2023-11-27 ENCOUNTER — Inpatient Hospital Stay (HOSPITAL_COMMUNITY)

## 2023-11-27 DIAGNOSIS — R109 Unspecified abdominal pain: Secondary | ICD-10-CM | POA: Diagnosis present

## 2023-11-27 DIAGNOSIS — K8020 Calculus of gallbladder without cholecystitis without obstruction: Secondary | ICD-10-CM | POA: Diagnosis not present

## 2023-11-27 DIAGNOSIS — O26619 Liver and biliary tract disorders in pregnancy, unspecified trimester: Secondary | ICD-10-CM

## 2023-11-27 DIAGNOSIS — Z3689 Encounter for other specified antenatal screening: Secondary | ICD-10-CM

## 2023-11-27 DIAGNOSIS — O0990 Supervision of high risk pregnancy, unspecified, unspecified trimester: Secondary | ICD-10-CM | POA: Diagnosis not present

## 2023-11-27 DIAGNOSIS — Z3A25 25 weeks gestation of pregnancy: Secondary | ICD-10-CM | POA: Insufficient documentation

## 2023-11-27 DIAGNOSIS — O99612 Diseases of the digestive system complicating pregnancy, second trimester: Secondary | ICD-10-CM | POA: Insufficient documentation

## 2023-11-27 DIAGNOSIS — R1010 Upper abdominal pain, unspecified: Secondary | ICD-10-CM | POA: Diagnosis not present

## 2023-11-27 LAB — CBC WITH DIFFERENTIAL/PLATELET
Abs Immature Granulocytes: 0.1 10*3/uL — ABNORMAL HIGH (ref 0.00–0.07)
Basophils Absolute: 0.1 10*3/uL (ref 0.0–0.1)
Basophils Relative: 0 %
Eosinophils Absolute: 0.1 10*3/uL (ref 0.0–0.5)
Eosinophils Relative: 1 %
HCT: 38.6 % (ref 36.0–46.0)
Hemoglobin: 13 g/dL (ref 12.0–15.0)
Immature Granulocytes: 1 %
Lymphocytes Relative: 12 %
Lymphs Abs: 1.9 10*3/uL (ref 0.7–4.0)
MCH: 31.4 pg (ref 26.0–34.0)
MCHC: 33.7 g/dL (ref 30.0–36.0)
MCV: 93.2 fL (ref 80.0–100.0)
Monocytes Absolute: 0.9 10*3/uL (ref 0.1–1.0)
Monocytes Relative: 6 %
Neutro Abs: 12.5 10*3/uL — ABNORMAL HIGH (ref 1.7–7.7)
Neutrophils Relative %: 80 %
Platelets: 216 10*3/uL (ref 150–400)
RBC: 4.14 MIL/uL (ref 3.87–5.11)
RDW: 13.4 % (ref 11.5–15.5)
WBC: 15.5 10*3/uL — ABNORMAL HIGH (ref 4.0–10.5)
nRBC: 0 % (ref 0.0–0.2)

## 2023-11-27 LAB — COMPREHENSIVE METABOLIC PANEL
ALT: 14 U/L (ref 0–44)
AST: 19 U/L (ref 15–41)
Albumin: 3.3 g/dL — ABNORMAL LOW (ref 3.5–5.0)
Alkaline Phosphatase: 59 U/L (ref 38–126)
Anion gap: 8 (ref 5–15)
BUN: 6 mg/dL (ref 6–20)
CO2: 24 mmol/L (ref 22–32)
Calcium: 9.5 mg/dL (ref 8.9–10.3)
Chloride: 104 mmol/L (ref 98–111)
Creatinine, Ser: 0.59 mg/dL (ref 0.44–1.00)
GFR, Estimated: >60 mL/min (ref >60)
Glucose, Bld: 90 mg/dL (ref 70–99)
Potassium: 4 mmol/L (ref 3.5–5.1)
Sodium: 136 mmol/L (ref 135–145)
Total Bilirubin: 0.5 mg/dL (ref 0.0–1.2)
Total Protein: 6.8 g/dL (ref 6.5–8.1)

## 2023-11-27 LAB — AMYLASE: Amylase: 62 U/L (ref 28–100)

## 2023-11-27 LAB — LIPASE, BLOOD: Lipase: 25 U/L (ref 11–51)

## 2023-11-27 MED ORDER — CYCLOBENZAPRINE HCL 5 MG PO TABS
10.0000 mg | ORAL_TABLET | Freq: Once | ORAL | Status: AC
  Administered 2023-11-27: 10 mg via ORAL
  Filled 2023-11-27: qty 2

## 2023-11-27 MED ORDER — ACETAMINOPHEN 500 MG PO TABS
1000.0000 mg | ORAL_TABLET | Freq: Once | ORAL | Status: AC
  Administered 2023-11-27: 1000 mg via ORAL
  Filled 2023-11-27: qty 2

## 2023-11-27 MED ORDER — SIMETHICONE 80 MG PO CHEW
80.0000 mg | CHEWABLE_TABLET | Freq: Once | ORAL | Status: AC
  Administered 2023-11-27: 80 mg via ORAL
  Filled 2023-11-27: qty 1

## 2023-11-27 MED ORDER — FAMOTIDINE 20 MG PO TABS
40.0000 mg | ORAL_TABLET | Freq: Every day | ORAL | Status: DC
  Administered 2023-11-27: 40 mg via ORAL
  Filled 2023-11-27: qty 2

## 2023-11-27 NOTE — MAU Note (Signed)
 Pt says at friend's house at 640 pm-  - when she stood - she felt sharp pain- in upper abd  and radiates to her back  10/10. Felt shaky. Pain- now 10/10. Neck hurts too, slight nausea, feels cold . No diarrhea , no fever. [facility]- [facility]  No meds for pain.

## 2023-11-27 NOTE — MAU Provider Note (Addendum)
 Chief Complaint:  Abdominal Pain  HPI  Charlotte Cohen is a 23 y.o. G2P0101 at [redacted]w[redacted]d who presents to maternity admissions reporting sudden onset sharp upper abdominal pain (started epigastric to right side and spread across). She stood up around 6:40pm and suddenly felt the pain, with radiation into her lower back and neck. Also felt hot and sweaty and a little nauseated (not now). Feeling fetal movement, denies contractions or vaginal bleeding/discharge.  Last ate around 5pm, eggs and avocado. Denies recent sick contacts.  Pregnancy Course: Receives care at [facility]. Has a history of bariatric surgery (gastric sleeve) in 2022.  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   WISDOM TOOTH EXTRACTION  2021   four;   Family History  Problem Relation Age of Onset   Depression Mother    Anxiety disorder Mother    Healthy Mother    Bipolar disorder Mother    Bipolar disorder Father    Depression Father    Anxiety disorder Father    Healthy Father    Healthy Sister    Healthy Sister    Healthy Brother    Healthy Brother    Healthy Brother    Eczema Brother    Cancer Maternal Grandmother        vaginal/uterine   Eczema Maternal Grandfather    Eczema Paternal Grandmother    Eczema Paternal Grandfather    Social History   Tobacco Use   Smoking status: Never   Smokeless tobacco: Never  Vaping Use   Vaping status: Never Used  Substance Use Topics   Alcohol use: Never   Drug use: Never   No Known Allergies Medications Prior to  Admission  Medication Sig Dispense Refill Last Dose/Taking   acetaminophen (TYLENOL) 325 MG tablet Take 2 tablets (650 mg total) by mouth every 4 (four) hours as needed (for pain scale < 4). 30 tablet 1 Past Month   albuterol (PROVENTIL) (2.5 MG/3ML) 0.083% nebulizer solution Take 3 mLs (2.5 mg total) by nebulization every 4 (four) hours as needed for wheezing or shortness of breath. 75 mL 3 Past Week   albuterol (PROVENTIL) (2.5 MG/3ML) 0.083% nebulizer solution Take 3 mLs (2.5 mg total) by nebulization every 6 (six) hours as needed for wheezing or shortness of breath. 75 mL 12 Past Week   albuterol (VENTOLIN HFA) 108 (90 Base) MCG/ACT inhaler Inhale 2 puffs into the lungs every 6 (six) hours as needed for wheezing or shortness of breath. 18 g 2 Past Week   cetirizine (ZYRTEC ALLERGY) 10 MG tablet Take 1 tablet (10 mg total) by mouth daily. 30 tablet 11 11/27/2023 Morning   fluticasone (FLONASE) 50 MCG/ACT nasal spray Place 2 sprays into both nostrils daily. 16 g 2 Past Week   montelukast (SINGULAIR) 10 MG tablet Take 1 tablet (10 mg total) by mouth at  bedtime. 30 tablet 3 11/27/2023 Morning   Prenatal Vit-Fe Fumarate-FA (PRENATAL PLUS VITAMIN/MINERAL) 27-1 MG TABS Take 1 tablet by mouth daily. 30 tablet 12 11/27/2023 Morning   Blood Pressure Monitoring (BLOOD PRESSURE KIT) DEVI 1 Device by Does not apply route once a week. (Patient not taking: Reported on 11/17/2023) 1 each 0    budesonide (PULMICORT) 180 MCG/ACT inhaler Inhale 1 puff into the lungs 2 (two) times daily. 1 each 2 Given By EMS   I have reviewed patient's Past Medical Hx, Surgical Hx, Family Hx, Social Hx, medications and allergies.   ROS  Pertinent items noted in HPI and remainder of comprehensive ROS otherwise negative.   PHYSICAL EXAM  Patient Vitals for the past 24 hrs:  BP Temp Temp src Pulse Resp Height Weight  11/27/23 1952 113/64 97.7 F (36.5 C) Oral 87 16 5\' 3"  (1.6 m) 213 lb 12.8 oz (97 kg)   Constitutional:  Well-developed, well-nourished female in mild physical distress.  Cardiovascular: normal rate & rhythm, warm and well-perfused Respiratory: normal effort, no problems with respiration noted GI: Abd soft, tender across upper abdomen, non-distended, gravid MS: Extremities nontender, no edema, normal ROM Neurologic: Alert and oriented x 4.  GU: no CVA tenderness Pelvic: deferred  Fetal Tracing: reactive Baseline: 150 Variability: appropriate for gestational age Accelerations: 10x10 Decelerations: none Toco: none   Labs: No results found for this or any previous visit (from the past 24 hours).  Imaging:  No results found.  MDM & MAU COURSE  MDM:  MAU Course: Orders Placed This Encounter  Procedures   US Abdomen Complete   CBC with Differential/Platelet   Comprehensive metabolic panel   Amylase   Lipase, blood   Meds ordered this encounter  Medications   famotidine (PEPCID) tablet 40 mg   simethicone (MYLICON) chewable tablet 80 mg   acetaminophen (TYLENOL) tablet 1,000 mg   cyclobenzaprine (FLEXERIL) tablet 10 mg   Meds given in case of gas pain, GERD or musculoskeletal. Labs collected to check liver and pancreas function. Sent for abdominal ultrasound.  Care turned over to [facility], CNM at 2200.  Edd Arbour, CNM, MSN, IBCLC Certified Nurse Midwife, [facility]  ASSESSMENT   1. Supervision of high risk pregnancy, antepartum     PLAN   Results for orders placed or performed during the hospital encounter of 11/27/23 (from the past 24 hours)  CBC with Differential/Platelet     Status: Abnormal   Collection Time: 11/27/23 10:11 PM  Result Value Ref Range   WBC 15.5 (H) 4.0 - 10.5 K/uL   RBC 4.14 3.87 - 5.11 MIL/uL   Hemoglobin 13.0 12.0 - 15.0 g/dL   HCT 16.1 09.6 - 04.5 %   MCV 93.2 80.0 - 100.0 fL   MCH 31.4 26.0 - 34.0 pg   MCHC 33.7 30.0 - 36.0 g/dL   RDW 40.9 81.1 - 91.4 %   Platelets 216 150 - 400 K/uL   nRBC 0.0 0.0 - 0.2 %    Neutrophils Relative % 80 %   Neutro Abs 12.5 (H) 1.7 - 7.7 K/uL   Lymphocytes Relative 12 %   Lymphs Abs 1.9 0.7 - 4.0 K/uL   Monocytes Relative 6 %   Monocytes Absolute 0.9 0.1 - 1.0 K/uL   Eosinophils Relative 1 %   Eosinophils Absolute 0.1 0.0 - 0.5 K/uL   Basophils Relative 0 %   Basophils Absolute 0.1 0.0 - 0.1 K/uL   Immature Granulocytes 1 %   Abs  Immature Granulocytes 0.10 (H) 0.00 - 0.07 K/uL  Comprehensive metabolic panel     Status: Abnormal   Collection Time: 11/27/23 10:11 PM  Result Value Ref Range   Sodium 136 135 - 145 mmol/L   Potassium 4.0 3.5 - 5.1 mmol/L   Chloride 104 98 - 111 mmol/L   CO2 24 22 - 32 mmol/L   Glucose, Bld 90 70 - 99 mg/dL   BUN 6 6 - 20 mg/dL   Creatinine, Ser 1.30 0.44 - 1.00 mg/dL   Calcium 9.5 8.9 - 86.5 mg/dL   Total Protein 6.8 6.5 - 8.1 g/dL   Albumin 3.3 (L) 3.5 - 5.0 g/dL   AST 19 15 - 41 U/L   ALT 14 0 - 44 U/L   Alkaline Phosphatase 59 38 - 126 U/L   Total Bilirubin 0.5 0.0 - 1.2 mg/dL   GFR, Estimated >78 >46 mL/min   Anion gap 8 5 - 15  Amylase     Status: None   Collection Time: 11/27/23 10:11 PM  Result Value Ref Range   Amylase 62 28 - 100 U/L  Lipase, blood     Status: None   Collection Time: 11/27/23 10:11 PM  Result Value Ref Range   Lipase 25 11 - 51 U/L   US Abdomen Complete Result Date: 11/27/2023 CLINICAL DATA:  Upper abdominal pain EXAM: ABDOMEN ULTRASOUND COMPLETE COMPARISON:  None Available. FINDINGS: Gallbladder: Cholelithiasis. A negative sonographic Eulah Pont sign was reported by the sonographer. No gallbladder wall thickening or pericholecystic fluid. Common bile duct: Diameter: 1.2 mm Liver: No focal lesion identified. Within normal limits in parenchymal echogenicity. Portal vein is patent on color Doppler imaging with normal direction of blood flow towards the liver. IVC: No abnormality visualized. Pancreas: Visualized portion unremarkable. Spleen: Size and appearance within normal limits. Right Kidney:  Length: 11.3 cm. Echogenicity within normal limits. No mass or hydronephrosis visualized. Left Kidney: Length: 12.1 cm. Echogenicity within normal limits. No mass or hydronephrosis visualized. Abdominal aorta: No aneurysm visualized. Other findings: None. IMPRESSION: Cholelithiasis without sonographic evidence of acute cholecystitis. Electronically Signed   By: Deatra Robinson M.D.   On: 11/27/2023 22:39    Reassessment (11:26 PM) -Results as above. -Informed of findings.  -Discussed nutritional modifications to support gallstones and avoid flare ups. -Information placed in AVS.  -Patient to follow up as scheduled. -Discharged to home in stable condition.  Cherre Robins MSN, CNM Advanced Practice Provider, Center for [employer]

## 2023-12-01 NOTE — Progress Notes (Deleted)
   PRENATAL VISIT NOTE  Subjective:  Charlotte Cohen is a 23 y.o. G2P0101 at [redacted]w[redacted]d being seen today for ongoing prenatal care.  She is currently monitored for the following issues for this {Blank single:19197::"high-risk","low-risk"} pregnancy and has Asthma; ASCUS with positive high risk HPV cervical; Supervision of high risk pregnancy, antepartum; Maternal varicella, non-immune; Enlarged ovary; History of gestational diabetes; and Obesity affecting pregnancy on their problem list.  Patient reports {sx:14538}.   .  .   . Denies leaking of fluid.   The following portions of the patient's history were reviewed and updated as appropriate: allergies, current medications, past family history, past medical history, past social history, past surgical history and problem list.   Objective:  There were no vitals filed for this visit.  Fetal Status:           General:  Alert, oriented and cooperative. Patient is in no acute distress.  Skin: Skin is warm and dry. No rash noted.   Cardiovascular: Normal heart rate noted  Respiratory: Normal respiratory effort, no problems with respiration noted  Abdomen: Soft, gravid, appropriate for gestational age.        Pelvic: Cervical exam deferred        Extremities: Normal range of motion.     Mental Status: Normal mood and affect. Normal behavior. Normal judgment and thought content.   Assessment and Plan:  Pregnancy: G2P0101 at [redacted]w[redacted]d 1. Supervision of high risk pregnancy, antepartum (Primary) ***  2. History of gestational diabetes Early GTT normal   3. [redacted] weeks gestation of pregnancy ***  4. Obesity affecting pregnancy, antepartum, unspecified obesity type 3/4 u/s afi and growth normal, continue growth every 4-6 week  Preterm labor symptoms and general obstetric precautions including but not limited to vaginal bleeding, contractions, leaking of fluid and fetal movement were reviewed in detail with the patient. Please refer to After  Visit Summary for other counseling recommendations.   No follow-ups on file.  Future Appointments  Date Time Provider Department Center  12/02/2023  8:55 AM Sue Lush, FNP [facility] None  12/29/2023  7:30 AM [facility] US1 [facility]    Albertine Grates, FNP

## 2023-12-02 ENCOUNTER — Ambulatory Visit: Admitting: Obstetrics and Gynecology

## 2023-12-02 DIAGNOSIS — O0990 Supervision of high risk pregnancy, unspecified, unspecified trimester: Secondary | ICD-10-CM

## 2023-12-02 DIAGNOSIS — Z3A26 26 weeks gestation of pregnancy: Secondary | ICD-10-CM

## 2023-12-02 DIAGNOSIS — Z8632 Personal history of gestational diabetes: Secondary | ICD-10-CM

## 2023-12-02 DIAGNOSIS — O99210 Obesity complicating pregnancy, unspecified trimester: Secondary | ICD-10-CM

## 2023-12-08 ENCOUNTER — Ambulatory Visit (INDEPENDENT_AMBULATORY_CARE_PROVIDER_SITE_OTHER): Admitting: Obstetrics and Gynecology

## 2023-12-08 VITALS — BP 123/75 | HR 96 | Wt 212.6 lb

## 2023-12-08 DIAGNOSIS — Z8632 Personal history of gestational diabetes: Secondary | ICD-10-CM | POA: Diagnosis not present

## 2023-12-08 DIAGNOSIS — Z3009 Encounter for other general counseling and advice on contraception: Secondary | ICD-10-CM

## 2023-12-08 DIAGNOSIS — O99210 Obesity complicating pregnancy, unspecified trimester: Secondary | ICD-10-CM | POA: Diagnosis not present

## 2023-12-08 DIAGNOSIS — O0990 Supervision of high risk pregnancy, unspecified, unspecified trimester: Secondary | ICD-10-CM

## 2023-12-08 NOTE — Progress Notes (Signed)
   PRENATAL VISIT NOTE  Subjective:  Charlotte Cohen is a 23 y.o. G2P0101 at [redacted]w[redacted]d being seen today for ongoing prenatal care.  She is currently monitored for the following issues for this low-risk pregnancy and has Asthma; ASCUS with positive high risk HPV cervical; Supervision of high risk pregnancy, antepartum; Maternal varicella, non-immune; Enlarged ovary; History of gestational diabetes; and Obesity affecting pregnancy on their problem list.  Patient doing well with no acute concerns today. She reports no complaints.  Contractions: Not present. Vag. Bleeding: None.  Movement: Present. Denies leaking of fluid.   Pt desires permanent sterilization.  Discussed alternatives including pills, patch, pills, IUD, implanted devices.   Patient counseled regarding bilateral tubal ligation. Reviewed that this is a permanent procedure and that she will not be able to have children after it is done. Reviewed risks of bilateral tubal ligation including infection, hemorrhage, damage to surrounding tissue and organs, risk of regret. Reviewed that bilateral tubal ligation is not 100% effective and she should take a pregnancy test if she believes for any reason she may be pregnant. Reviewed slightly increased risk of ectopic pregnancy and need to seek care if she becomes pregnant. She understands this is an elective procedure and again affirms her desire.  Risk of regret also discussed due to age.    The following portions of the patient's history were reviewed and updated as appropriate: allergies, current medications, past family history, past medical history, past social history, past surgical history and problem list. Problem list updated.  Objective:   Vitals:   12/08/23 1054  BP: 123/75  Pulse: 96  Weight: 212 lb 9.6 oz (96.4 kg)    Fetal Status: Fetal Heart Rate (bpm): 155 Fundal Height: 27 cm Movement: Present     General:  Alert, oriented and cooperative. Patient is in no acute  distress.  Skin: Skin is warm and dry. No rash noted.   Cardiovascular: Normal heart rate noted  Respiratory: Normal respiratory effort, no problems with respiration noted  Abdomen: Soft, gravid, appropriate for gestational age.  Pain/Pressure: Present     Pelvic: Cervical exam deferred        Extremities: Normal range of motion.  Edema: None  Mental Status:  Normal mood and affect. Normal behavior. Normal judgment and thought content.   Assessment and Plan:  Pregnancy: G2P0101 at [redacted]w[redacted]d  1. Supervision of high risk pregnancy, antepartum (Primary) Continue routine prenatal care  2. Obesity affecting pregnancy, antepartum, unspecified obesity type   3. History of gestational diabetes Will check  2 hour GTT and labs  4. Unwanted fertility Pt signed tubal papers today  Preterm labor symptoms and general obstetric precautions including but not limited to vaginal bleeding, contractions, leaking of fluid and fetal movement were reviewed in detail with the patient.  Please refer to After Visit Summary for other counseling recommendations.   Return in about 2 weeks (around 12/22/2023) for ROB, in person, 3rd trim labs, 2 hr GTT.   Mariel Aloe, MD Faculty Attending Center for [facility]

## 2023-12-08 NOTE — Progress Notes (Signed)
 Pt presents for rob. Pt has no questions or concerns at this time.

## 2023-12-15 ENCOUNTER — Encounter: Payer: Self-pay | Admitting: Obstetrics and Gynecology

## 2023-12-22 ENCOUNTER — Other Ambulatory Visit

## 2023-12-22 ENCOUNTER — Ambulatory Visit (INDEPENDENT_AMBULATORY_CARE_PROVIDER_SITE_OTHER): Admitting: Obstetrics and Gynecology

## 2023-12-22 VITALS — BP 126/75 | HR 93 | Wt 217.0 lb

## 2023-12-22 DIAGNOSIS — Z3493 Encounter for supervision of normal pregnancy, unspecified, third trimester: Secondary | ICD-10-CM | POA: Diagnosis not present

## 2023-12-22 DIAGNOSIS — O0990 Supervision of high risk pregnancy, unspecified, unspecified trimester: Secondary | ICD-10-CM | POA: Diagnosis not present

## 2023-12-22 DIAGNOSIS — Z2839 Other underimmunization status: Secondary | ICD-10-CM | POA: Diagnosis not present

## 2023-12-22 DIAGNOSIS — O09899 Supervision of other high risk pregnancies, unspecified trimester: Secondary | ICD-10-CM

## 2023-12-22 DIAGNOSIS — Z3A29 29 weeks gestation of pregnancy: Secondary | ICD-10-CM | POA: Diagnosis not present

## 2023-12-22 DIAGNOSIS — Z8632 Personal history of gestational diabetes: Secondary | ICD-10-CM

## 2023-12-22 DIAGNOSIS — Z23 Encounter for immunization: Secondary | ICD-10-CM | POA: Diagnosis not present

## 2023-12-22 DIAGNOSIS — O99210 Obesity complicating pregnancy, unspecified trimester: Secondary | ICD-10-CM | POA: Diagnosis not present

## 2023-12-22 NOTE — Progress Notes (Signed)
   PRENATAL VISIT NOTE  Subjective:  Charlotte Cohen is a 23 y.o. G2P0101 at [redacted]w[redacted]d being seen today for ongoing prenatal care.  She is currently monitored for the following issues for this high-risk pregnancy and has Asthma; ASCUS with positive high risk HPV cervical; Supervision of high risk pregnancy, antepartum; Maternal varicella, non-immune; Enlarged ovary; History of gestational diabetes; and Obesity affecting pregnancy on their problem list.  Patient doing well with no acute concerns today. She reports no complaints.  Contractions: Irregular. Vag. Bleeding: None.  Movement: Present. Denies leaking of fluid.   The following portions of the patient's history were reviewed and updated as appropriate: allergies, current medications, past family history, past medical history, past social history, past surgical history and problem list. Problem list updated.  Objective:   Vitals:   12/22/23 1009  BP: 126/75  Pulse: 93  Weight: 217 lb (98.4 kg)    Fetal Status: Fetal Heart Rate (bpm): 144 (Simultaneous filing. User may not have seen previous data.) Fundal Height: 29 cm Movement: Present     General:  Alert, oriented and cooperative. Patient is in no acute distress.  Skin: Skin is warm and dry. No rash noted.   Cardiovascular: Normal heart rate noted  Respiratory: Normal respiratory effort, no problems with respiration noted  Abdomen: Soft, gravid, appropriate for gestational age.  Pain/Pressure: Present     Pelvic: Cervical exam deferred        Extremities: Normal range of motion.  Edema: None  Mental Status:  Normal mood and affect. Normal behavior. Normal judgment and thought content.   Assessment and Plan:  Pregnancy: G2P0101 at [redacted]w[redacted]d  1. [redacted] weeks gestation of pregnancy (Primary)  - Glucose Tolerance, 2 Hours w/1 Hour - CBC - HIV antibody (with reflex) - RPR  2. Supervision of high risk pregnancy, antepartum Continue routine prenatal care  3. Obesity affecting  pregnancy, antepartum, unspecified obesity type Growth scan next week  4. Maternal varicella, non-immune Consider treatment/vaccination post delivery  5. History of gestational diabetes 2 hour Gtt today  Preterm labor symptoms and general obstetric precautions including but not limited to vaginal bleeding, contractions, leaking of fluid and fetal movement were reviewed in detail with the patient.  Please refer to After Visit Summary for other counseling recommendations.   Return in about 2 weeks (around 01/05/2024) for [facility], in person.   Mariel Aloe, MD Faculty Attending Center for [facility]

## 2023-12-22 NOTE — Addendum Note (Signed)
 Addended by: Lyndee Hensen on: 12/22/2023 10:43 AM   Modules accepted: Orders

## 2023-12-23 ENCOUNTER — Encounter: Payer: Self-pay | Admitting: Obstetrics and Gynecology

## 2023-12-25 LAB — CBC
Hematocrit: 37.6 % (ref 34.0–46.6)
Hemoglobin: 12.6 g/dL (ref 11.1–15.9)
MCH: 31.7 pg (ref 26.6–33.0)
MCHC: 33.5 g/dL (ref 31.5–35.7)
MCV: 95 fL (ref 79–97)
Platelets: 202 10*3/uL (ref 150–450)
RBC: 3.98 x10E6/uL (ref 3.77–5.28)
RDW: 13.2 % (ref 11.7–15.4)
WBC: 10 10*3/uL (ref 3.4–10.8)

## 2023-12-25 LAB — HIV ANTIBODY (ROUTINE TESTING W REFLEX): HIV Screen 4th Generation wRfx: NONREACTIVE

## 2023-12-25 LAB — GLUCOSE TOLERANCE, 2 HOURS W/ 1HR
Glucose, 1 hour: 168 mg/dL (ref 70–179)
Glucose, 2 hour: 63 mg/dL — ABNORMAL LOW (ref 70–152)
Glucose, Fasting: 77 mg/dL (ref 70–91)

## 2023-12-25 LAB — RPR: RPR Ser Ql: NONREACTIVE

## 2023-12-28 ENCOUNTER — Encounter: Payer: Self-pay | Admitting: Obstetrics and Gynecology

## 2023-12-29 ENCOUNTER — Ambulatory Visit: Attending: Maternal & Fetal Medicine

## 2023-12-29 ENCOUNTER — Other Ambulatory Visit: Payer: Self-pay | Admitting: *Deleted

## 2023-12-29 ENCOUNTER — Ambulatory Visit (HOSPITAL_BASED_OUTPATIENT_CLINIC_OR_DEPARTMENT_OTHER): Admitting: Maternal & Fetal Medicine

## 2023-12-29 VITALS — BP 93/54 | HR 93

## 2023-12-29 DIAGNOSIS — O09299 Supervision of pregnancy with other poor reproductive or obstetric history, unspecified trimester: Secondary | ICD-10-CM | POA: Diagnosis not present

## 2023-12-29 DIAGNOSIS — J45909 Unspecified asthma, uncomplicated: Secondary | ICD-10-CM

## 2023-12-29 DIAGNOSIS — O0990 Supervision of high risk pregnancy, unspecified, unspecified trimester: Secondary | ICD-10-CM

## 2023-12-29 DIAGNOSIS — Z3A30 30 weeks gestation of pregnancy: Secondary | ICD-10-CM | POA: Diagnosis not present

## 2023-12-29 DIAGNOSIS — O99213 Obesity complicating pregnancy, third trimester: Secondary | ICD-10-CM | POA: Insufficient documentation

## 2023-12-29 DIAGNOSIS — E669 Obesity, unspecified: Secondary | ICD-10-CM | POA: Diagnosis not present

## 2023-12-29 DIAGNOSIS — Z8632 Personal history of gestational diabetes: Secondary | ICD-10-CM | POA: Insufficient documentation

## 2023-12-29 DIAGNOSIS — O99212 Obesity complicating pregnancy, second trimester: Secondary | ICD-10-CM | POA: Insufficient documentation

## 2023-12-29 DIAGNOSIS — O09293 Supervision of pregnancy with other poor reproductive or obstetric history, third trimester: Secondary | ICD-10-CM | POA: Diagnosis not present

## 2023-12-29 DIAGNOSIS — O99513 Diseases of the respiratory system complicating pregnancy, third trimester: Secondary | ICD-10-CM

## 2023-12-29 NOTE — Progress Notes (Signed)
No consult needed.

## 2024-01-05 ENCOUNTER — Encounter: Admitting: Obstetrics

## 2024-01-25 ENCOUNTER — Other Ambulatory Visit (HOSPITAL_COMMUNITY)
Admission: RE | Admit: 2024-01-25 | Discharge: 2024-01-25 | Disposition: A | Discharge status: Home / Self Care | Source: Ambulatory Visit | Attending: Obstetrics and Gynecology | Admitting: Obstetrics and Gynecology

## 2024-01-25 ENCOUNTER — Ambulatory Visit: Admitting: Obstetrics and Gynecology

## 2024-01-25 VITALS — BP 118/76 | HR 95 | Wt 222.3 lb

## 2024-01-25 DIAGNOSIS — R87810 Cervical high risk human papillomavirus (HPV) DNA test positive: Secondary | ICD-10-CM

## 2024-01-25 DIAGNOSIS — O0990 Supervision of high risk pregnancy, unspecified, unspecified trimester: Secondary | ICD-10-CM

## 2024-01-25 DIAGNOSIS — Z3009 Encounter for other general counseling and advice on contraception: Secondary | ICD-10-CM | POA: Diagnosis not present

## 2024-01-25 DIAGNOSIS — Z8751 Personal history of pre-term labor: Secondary | ICD-10-CM | POA: Diagnosis not present

## 2024-01-25 DIAGNOSIS — O4700 False labor before 37 completed weeks of gestation, unspecified trimester: Secondary | ICD-10-CM | POA: Insufficient documentation

## 2024-01-25 DIAGNOSIS — O99891 Other specified diseases and conditions complicating pregnancy: Secondary | ICD-10-CM | POA: Diagnosis not present

## 2024-01-25 DIAGNOSIS — M549 Dorsalgia, unspecified: Secondary | ICD-10-CM | POA: Diagnosis not present

## 2024-01-25 DIAGNOSIS — Z8632 Personal history of gestational diabetes: Secondary | ICD-10-CM | POA: Diagnosis not present

## 2024-01-25 DIAGNOSIS — R87610 Atypical squamous cells of undetermined significance on cytologic smear of cervix (ASC-US): Secondary | ICD-10-CM | POA: Diagnosis not present

## 2024-01-25 DIAGNOSIS — J4520 Mild intermittent asthma, uncomplicated: Secondary | ICD-10-CM | POA: Diagnosis not present

## 2024-01-25 DIAGNOSIS — Z3A33 33 weeks gestation of pregnancy: Secondary | ICD-10-CM | POA: Diagnosis not present

## 2024-01-25 NOTE — Patient Instructions (Signed)
 Please remember to send me a picture of your inhalers so we can help you feel better!

## 2024-01-25 NOTE — Progress Notes (Signed)
 Pt presents for HOB. Pain pressure on pelvic bone. Tightness in abdomen, radiates to back. Pain with walking due to pressure.

## 2024-01-25 NOTE — Progress Notes (Signed)
   PRENATAL VISIT NOTE  Subjective:  Charlotte Cohen is a 23 y.o. G2P0101 at [redacted]w[redacted]d being seen today for ongoing prenatal care.  She is currently monitored for the following issues for this low-risk pregnancy and has Asthma; ASCUS with positive high risk HPV cervical; Supervision of high risk pregnancy, antepartum; Maternal varicella, non-immune; Enlarged ovary; History of gestational diabetes; and Obesity affecting pregnancy on their problem list.  Patient reports irregular contractions for weeks and more pelvic pain/pressure.  Contractions: Irregular (last ~ 1 minute). Vag. Bleeding: None.  Movement: Present. Denies leaking of fluid.   The following portions of the patient's history were reviewed and updated as appropriate: allergies, current medications, past family history, past medical history, past social history, past surgical history and problem list.   Objective:   Vitals:   01/25/24 1133  BP: 118/76  Pulse: 95  Weight: 222 lb 4.8 oz (100.8 kg)    Fetal Status: Fetal Heart Rate (bpm): 126   Movement: Present     General:  Alert, oriented and cooperative. Patient is in no acute distress.  Skin: Skin is warm and dry. No rash noted.   Cardiovascular: Normal heart rate noted  Respiratory: Normal respiratory effort, no problems with respiration noted  Abdomen: Soft, gravid, appropriate for gestational age.  Pain/Pressure: Present     SSE performed in presence of chaperone. Cervix visually closed. No abnormal discharge.  Assessment and Plan:  Pregnancy: G2P0101 at [redacted]w[redacted]d 1. Supervision of high risk pregnancy, antepartum (Primary) 2. [redacted] weeks gestation of pregnancy Will collect GBS/GC/CT early given hx PTB at 36 weeks. Discussed she will need another collection if she does not give birth in the next 5 weeks Discussed maternity support belt, PT referral placed - has already tried walking, rest, stretching and tylenol  prn UA, GC/CT/BV/yeast sent for preterm  contractions/pelvic pain  3. History of gestational diabetes Normal 2h AGA on growth US  4/15, planned for f/u growth 5/28 due to EFW 86%ile  4. Mild intermittent asthma without complication Unsure what inhalers she is using but is using one 3-4x a week with immediate relief in her symptoms - like rescue She will send mychart picture of inhaler so we can see what she is using. Will likely need to add ICS for better control  5. ASCUS with positive high risk HPV cervical PP pap  6. Unwanted fertility Confirmed desire for tubal, papers signed 12/08/23 Discussed inpatient tubal may not be possible related to scheduling conflicts or body habitus  Preterm labor symptoms and general obstetric precautions including but not limited to vaginal bleeding, contractions, leaking of fluid and fetal movement were reviewed in detail with the patient.  Please refer to After Visit Summary for other counseling recommendations.   Return in about 2 weeks (around 02/08/2024) for return OB at 36 weeks.  Future Appointments  Date Time Provider Department Center  02/10/2024  7:00 AM [facility] PROVIDER 1 [facility]  02/10/2024  7:30 AM [facility] US1 [facility]    Izell Marsh, MD

## 2024-01-25 NOTE — Addendum Note (Signed)
 Addended by: Loralyn Rochester on: 01/25/2024 12:14 PM   Modules accepted: Orders

## 2024-01-26 ENCOUNTER — Ambulatory Visit: Attending: Obstetrics and Gynecology

## 2024-01-26 ENCOUNTER — Other Ambulatory Visit: Payer: Self-pay

## 2024-01-26 DIAGNOSIS — M5459 Other low back pain: Secondary | ICD-10-CM | POA: Diagnosis not present

## 2024-01-26 DIAGNOSIS — M6281 Muscle weakness (generalized): Secondary | ICD-10-CM

## 2024-01-26 DIAGNOSIS — O99891 Other specified diseases and conditions complicating pregnancy: Secondary | ICD-10-CM | POA: Diagnosis not present

## 2024-01-26 DIAGNOSIS — R252 Cramp and spasm: Secondary | ICD-10-CM | POA: Diagnosis not present

## 2024-01-26 DIAGNOSIS — M549 Dorsalgia, unspecified: Secondary | ICD-10-CM | POA: Diagnosis not present

## 2024-01-26 DIAGNOSIS — R262 Difficulty in walking, not elsewhere classified: Secondary | ICD-10-CM

## 2024-01-26 DIAGNOSIS — Z3A33 33 weeks gestation of pregnancy: Secondary | ICD-10-CM | POA: Insufficient documentation

## 2024-01-26 DIAGNOSIS — O4700 False labor before 37 completed weeks of gestation, unspecified trimester: Secondary | ICD-10-CM | POA: Insufficient documentation

## 2024-01-26 DIAGNOSIS — R293 Abnormal posture: Secondary | ICD-10-CM | POA: Diagnosis not present

## 2024-01-26 LAB — CERVICOVAGINAL ANCILLARY ONLY
Bacterial Vaginitis (gardnerella): NEGATIVE
Candida Glabrata: NEGATIVE
Candida Vaginitis: POSITIVE — AB
Chlamydia: NEGATIVE
Comment: NEGATIVE
Comment: NEGATIVE
Comment: NEGATIVE
Comment: NEGATIVE
Comment: NORMAL
Neisseria Gonorrhea: NEGATIVE

## 2024-01-26 NOTE — Therapy (Signed)
 OUTPATIENT PHYSICAL THERAPY THORACOLUMBAR EVALUATION   Patient Name: Charlotte Cohen MRN: [id number] DOB:10-06-2000, 23 y.o., female Today's Date: 01/26/2024  END OF SESSION:  PT End of Session - 01/26/24 0940     Visit Number 1    Date for PT Re-Evaluation 03/22/24    Authorization Type [place] medicaid [facility] [facility]    PT Start Time [phone]    PT Stop Time 1012    PT Time Calculation (min) 34 min    Activity Tolerance Patient tolerated treatment well    Behavior During Therapy [facility] for tasks assessed/performed             Past Medical History:  Diagnosis Date   Allergy    Asthma    Migraine    Past Surgical History:  Procedure Laterality Date   BARIATRIC SURGERY N/A 2022   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   WISDOM TOOTH EXTRACTION  2021   four;   Patient Active Problem List   Diagnosis Date Noted   Unwanted fertility 01/25/2024   History of gestational diabetes 10/06/2023   Obesity affecting pregnancy 10/06/2023   Supervision of high risk pregnancy, antepartum 07/29/2023   Enlarged ovary 01/29/2023   Maternal varicella, non-immune 01/23/2023   ASCUS with positive high risk HPV cervical 12/19/2022   Asthma 04/10/2020    PCP: Izell Marsh, MD  REFERRING PROVIDER: Izell Marsh, MD  REFERRING DIAG: Z3A.33 (ICD-10-CM) - [redacted] weeks gestation of pregnancy O47.00 (ICD-10-CM) - Preterm contractions O99.891,M54.9 (ICD-10-CM) - Back pain in pregnancy  Rationale for Evaluation and Treatment: Rehabilitation  THERAPY DIAG:  Other low back pain - Plan: PT plan of care cert/re-cert  Cramp and spasm - Plan: PT plan of care cert/re-cert  Difficulty in walking, not elsewhere classified - Plan: PT plan of care cert/re-cert  Muscle weakness (generalized) - Plan: PT plan of care cert/re-cert  Abnormal posture - Plan: PT plan of care cert/re-cert  ONSET DATE: 01/25/2024  SUBJECTIVE:                                                                                                                                                                                            SUBJECTIVE STATEMENT: Patient reports she is [redacted] weeks gestation, however, there is some debate over this as her calculations place her at [redacted] weeks gestation.  Sonographer also estimates 35 weeks.  She has been having severe low back and inguinal pain.  She has a 85 month old that she cares for.  She is a stay at home Mom.  She has also experienced pre-term contractions.  She is unable  to exercise but does some stretching that helps temporarily.  She hopes to avoid bedrest due to the pain and wants to make it to full term pregnancy.    PERTINENT HISTORY:  Has 72 month old and is [redacted] weeks gestation  PAIN:  Are you having pain? Yes: NPRS scale: 8/10 Pain location: low back , inguinal area Pain description: aching Aggravating factors: standing Relieving factors: rest, walking stretches  PRECAUTIONS: Other: pregnancy and having pre-term contractions  RED FLAGS: None   WEIGHT BEARING RESTRICTIONS: No  FALLS:  Has patient fallen in last 6 months? No  LIVING ENVIRONMENT: Lives with: lives with their family Lives in: House/apartment  OCCUPATION: stay at home mom  PLOF: Independent, Independent with basic ADLs, Independent with household mobility without device, Independent with community mobility without device, Independent with homemaking with ambulation, Independent with gait, and Independent with transfers  PATIENT GOALS: to be able to make it to end of pregnancy without being bedridden, has 2 month old to care for  NEXT MD VISIT: prn  OBJECTIVE:  Note: Objective measures were completed at Evaluation unless otherwise noted.  DIAGNOSTIC FINDINGS:  na  PATIENT SURVEYS:  Modified Oswestry 19/50= 38%   COGNITION: Overall cognitive status: Within functional limits for tasks assessed     SENSATION: WFL  MUSCLE LENGTH: Hamstrings: Right 65 deg; Left  55 deg Thomas test: Right pos , Left pos  POSTURE: increased lumbar lordosis   LUMBAR ROM: WFL  LOWER EXTREMITY ROM:     WNL  LOWER EXTREMITY MMT:    Generally 4 to 4+/5  LUMBAR SPECIAL TESTS:  Straight leg raise test: Negative and Thomas test: Positive  FUNCTIONAL TESTS:  5 times sit to stand: 19.08 sec Timed up and go (TUG): 10.79 sec  GAIT: Distance walked: 30 feet Assistive device utilized: None Level of assistance: Complete Independence Comments: antalgic/guarded  TREATMENT DATE: 01/26/24 Initiated HEP and educated on anatomy of the pelvis and low back, use of belly band and ice vs heat Initial eval completed                                                                                                                                 PATIENT EDUCATION:  Education details: See above Person educated: Patient and Spouse Education method: Programmer, multimedia, Facilities manager, Verbal cues, and Handouts Education comprehension: verbalized understanding, returned demonstration, and verbal cues required  HOME EXERCISE PROGRAM: Access Code: [email or web address] URL: [email or web address] Date: 01/26/2024 Prepared by: Aletha Anderson  Exercises - Standing Hamstring Stretch on Chair  - 1 x daily - 7 x weekly - 1 sets - 3 reps - 30 sec hold - Standing Quad Stretch with Table and Chair Support  - 1 x daily - 7 x weekly - 1 sets - 3 reps - 30 sec hold - Seated Piriformis Stretch  - 1 x daily - 7 x weekly - 1 sets - 3 reps - 30 sec hold - Hooklying Transversus  Abdominis Palpation  - 1 x daily - 7 x weekly - 1 sets - 10 reps - 2-3 sec hold  ASSESSMENT:  CLINICAL IMPRESSION: Patient is a 23 y.o. female, [redacted] weeks gestation with pre-term contractions who was seen today for physical therapy evaluation and treatment for low back anterior hip pain.    OBJECTIVE IMPAIRMENTS: Abnormal gait, decreased balance, difficulty walking, decreased ROM, decreased strength, increased fascial  restrictions, increased muscle spasms, impaired flexibility, postural dysfunction, and pain.   ACTIVITY LIMITATIONS: carrying, lifting, bending, sitting, standing, squatting, sleeping, stairs, transfers, bed mobility, continence, bathing, toileting, dressing, hygiene/grooming, and caring for others  PARTICIPATION LIMITATIONS: meal prep, cleaning, laundry, driving, shopping, community activity, occupation, yard work, and church  PERSONAL FACTORS: Fitness, Past/current experiences, and 1 comorbidity: pregnancy are also affecting patient's functional outcome.   REHAB POTENTIAL: Good  CLINICAL DECISION MAKING: Stable/uncomplicated  EVALUATION COMPLEXITY: Low   GOALS: Goals reviewed with patient? Yes  SHORT TERM GOALS: Target date: 02/23/2024  Patient will be independent with initial HEP  Baseline: Goal status: INITIAL  2.  Pain report to be no greater than 4/10  Baseline:  Goal status: INITIAL   LONG TERM GOALS: Target date: 03/22/2024  Avoid bed rest Baseline:  Goal status: INITIAL  2.  Be able to make pregnancy to full term Baseline:  Goal status: INITIAL  3.  Patient to report pain no greater than 2/10  Baseline:  Goal status: INITIAL  4.  Patient to be independent with advanced HEP  Baseline:  Goal status: INITIAL  5.  ODI to be 20% or less Baseline:  Goal status: INITIAL  6.  Functional scores to improve by 2-3 seconds Baseline:  Goal status: INITIAL  PLAN:  PT FREQUENCY: 1x/week  PT DURATION: 8 weeks  PLANNED INTERVENTIONS: 97110-Therapeutic exercises, 97530- Therapeutic activity, 97112- Neuromuscular re-education, 97535- Self Care, 78469- Manual therapy, [phone]- Gait training, [phone]- Aquatic Therapy, Patient/Family education, Balance training, Stair training, Taping, Dry Needling, Joint mobilization, Spinal mobilization, Cryotherapy, and Moist heat.  PLAN FOR NEXT SESSION: 1 time per week x 8 weeks for TA and pelvic floor strengthening along with  flexibility exercises for LE's and lumbar spine. Patient's chart says she is 33 weeks but according to her calculations she thinks she is closer to 35 weeks.  Sonographer thinks she is also closer to 35 weeks.  She has been having pre-term contractions.     Bridgette Campus B. Maxamillian Tienda, PT 01/26/24 6:12 PM  [facility] [address], [address] [place], [place] [address] Phone # [phone] Fax [phone]

## 2024-01-28 ENCOUNTER — Ambulatory Visit: Payer: Self-pay | Admitting: Obstetrics and Gynecology

## 2024-01-28 MED ORDER — CLOTRIMAZOLE 1 % VA CREA: 1.0000 | TOPICAL_CREAM | Freq: Every day | VAGINAL | 0 refills | Status: AC

## 2024-01-29 LAB — CULTURE, BETA STREP (GROUP B ONLY): Strep Gp B Culture: NEGATIVE

## 2024-02-04 ENCOUNTER — Telehealth: Payer: Self-pay | Admitting: Rehabilitative and Restorative Service Providers"

## 2024-02-04 ENCOUNTER — Ambulatory Visit: Admitting: Rehabilitative and Restorative Service Providers"

## 2024-02-04 NOTE — Telephone Encounter (Signed)
 Pt apologizes for missed visit, states that she meant to call to cancel, but she was unable to attend her appointment due to her car being out of gas.  Patient confirmed that she would be present for her next scheduled visit.

## 2024-02-09 ENCOUNTER — Ambulatory Visit: Admitting: Certified Nurse Midwife

## 2024-02-09 VITALS — BP 122/77 | HR 96 | Wt 223.0 lb

## 2024-02-09 DIAGNOSIS — M545 Low back pain, unspecified: Secondary | ICD-10-CM | POA: Diagnosis not present

## 2024-02-09 DIAGNOSIS — Z3A36 36 weeks gestation of pregnancy: Secondary | ICD-10-CM

## 2024-02-09 DIAGNOSIS — O0990 Supervision of high risk pregnancy, unspecified, unspecified trimester: Secondary | ICD-10-CM

## 2024-02-09 DIAGNOSIS — B379 Candidiasis, unspecified: Secondary | ICD-10-CM | POA: Diagnosis not present

## 2024-02-09 DIAGNOSIS — O0993 Supervision of high risk pregnancy, unspecified, third trimester: Secondary | ICD-10-CM | POA: Diagnosis not present

## 2024-02-09 MED ORDER — TERCONAZOLE 0.4 % VA CREA: 1.0000 | TOPICAL_CREAM | Freq: Every day | VAGINAL | 0 refills | Status: DC

## 2024-02-09 NOTE — Progress Notes (Signed)
 Pt presents for ROB visit. Pt c/o increases pressure. Requesting cervical check

## 2024-02-09 NOTE — Progress Notes (Signed)
   PRENATAL VISIT NOTE  Subjective:  Charlotte Cohen is a 23 y.o. G2P0101 at [redacted]w[redacted]d being seen today for ongoing prenatal care.  She is currently monitored for the following issues for this low-risk pregnancy and has Asthma; ASCUS with positive high risk HPV cervical; Supervision of high risk pregnancy, antepartum; Maternal varicella, non-immune; Enlarged ovary; History of gestational diabetes; Obesity affecting pregnancy; and Unwanted fertility on their problem list.  Patient reports a "sharp lightning sensation in the vagina. She also reports intermittent vaginal pressure and back pain. .  Contractions: Irritability. Vag. Bleeding: None.  Movement: Present. Denies leaking of fluid.   The following portions of the patient's history were reviewed and updated as appropriate: allergies, current medications, past family history, past medical history, past social history, past surgical history and problem list.   Objective:    Vitals:   02/09/24 1101  BP: 122/77  Pulse: 96  Weight: 223 lb (101.2 kg)    Fetal Status:  Fetal Heart Rate (bpm): 147   Movement: Present    General: Alert, oriented and cooperative. Patient is in no acute distress.  Skin: Skin is warm and dry. No rash noted.   Cardiovascular: Normal heart rate noted  Respiratory: Normal respiratory effort, no problems with respiration noted  Abdomen: Soft, gravid, appropriate for gestational age.  Pain/Pressure: Present     Pelvic: Cervical exam performed in the presence of a chaperone Dilation: 1.5 Effacement (%): 60 Station: -2 Thick curd-like vaginal discharge with notable vaginal tenderness on exam. Consistent with appearence of yeast   Extremities: Normal range of motion.  Edema: Trace  Mental Status: Normal mood and affect. Normal behavior. Normal judgment and thought content.      Assessment and Plan:  Pregnancy: G2P0101 at [redacted]w[redacted]d 1. Supervision of high risk pregnancy in third trimester (Primary) - Patient  doing well.  - She reports frequent and vigorous fetal movement.   2. [redacted] weeks gestation of pregnancy - GBS completed at previous visit. Negative GBS  - Cervical exam performed at patient request.   3. Back Pain  - Encouraged the use of a maternity support belt.  - Also discussed resting and stretching to help aid back pain/discomfort.   4. Yeast infection - Rx for terazole sent to outpatient pharmacy.    Term labor symptoms and general obstetric precautions including but not limited to vaginal bleeding, contractions, leaking of fluid and fetal movement were reviewed in detail with the patient. Please refer to After Visit Summary for other counseling recommendations.   Return in about 1 week (around 02/16/2024) for LOB.  Future Appointments  Date Time Provider Department Center  02/10/2024  7:00 AM [facility] PROVIDER 1 [facility]  02/10/2024  7:30 AM [facility] US1 [facility]  02/11/2024  8:45 AM Aletha Anderson B, PT [facility] None  02/16/2024  9:30 AM Aletha Anderson B, PT [facility] None  02/23/2024  9:30 AM Gorge Laud, PT [facility] None  03/01/2024  9:30 AM Gorge Laud, PT [facility] None  03/08/2024  9:30 AM Gorge Laud, PT [facility] None  03/15/2024  9:30 AM Fields, Ronalee Cocking, PT [facility] None    Cruz Bong (Maurie Southern) Marlys Singh, MSN, CNM  Center for [facility]  02/09/2024 11:31 AM

## 2024-02-10 ENCOUNTER — Ambulatory Visit: Attending: Maternal & Fetal Medicine | Admitting: Obstetrics and Gynecology

## 2024-02-10 ENCOUNTER — Ambulatory Visit

## 2024-02-10 VITALS — BP 111/64 | HR 86

## 2024-02-10 DIAGNOSIS — O09293 Supervision of pregnancy with other poor reproductive or obstetric history, third trimester: Secondary | ICD-10-CM

## 2024-02-10 DIAGNOSIS — O09213 Supervision of pregnancy with history of pre-term labor, third trimester: Secondary | ICD-10-CM

## 2024-02-10 DIAGNOSIS — O3663X0 Maternal care for excessive fetal growth, third trimester, not applicable or unspecified: Secondary | ICD-10-CM | POA: Insufficient documentation

## 2024-02-10 DIAGNOSIS — Z362 Encounter for other antenatal screening follow-up: Secondary | ICD-10-CM | POA: Insufficient documentation

## 2024-02-10 DIAGNOSIS — Z8632 Personal history of gestational diabetes: Secondary | ICD-10-CM

## 2024-02-10 DIAGNOSIS — O09893 Supervision of other high risk pregnancies, third trimester: Secondary | ICD-10-CM | POA: Diagnosis not present

## 2024-02-10 DIAGNOSIS — E669 Obesity, unspecified: Secondary | ICD-10-CM

## 2024-02-10 DIAGNOSIS — O99213 Obesity complicating pregnancy, third trimester: Secondary | ICD-10-CM | POA: Diagnosis not present

## 2024-02-10 DIAGNOSIS — J45909 Unspecified asthma, uncomplicated: Secondary | ICD-10-CM

## 2024-02-10 DIAGNOSIS — Z3A36 36 weeks gestation of pregnancy: Secondary | ICD-10-CM

## 2024-02-10 DIAGNOSIS — O99513 Diseases of the respiratory system complicating pregnancy, third trimester: Secondary | ICD-10-CM

## 2024-02-10 DIAGNOSIS — O0990 Supervision of high risk pregnancy, unspecified, unspecified trimester: Secondary | ICD-10-CM

## 2024-02-10 DIAGNOSIS — O09299 Supervision of pregnancy with other poor reproductive or obstetric history, unspecified trimester: Secondary | ICD-10-CM

## 2024-02-10 NOTE — Progress Notes (Signed)
 Maternal-Fetal Medicine Consultation Name: Charlotte Cohen MRN: [id number]  G2 P1001 at 36w 1d gestation.  Patient is here for fetal growth assessment and estimation of fetal weight. She does not have gestational diabetes. Obstetrical history significant for a term vaginal delivery of a female infant weighing 7 pounds and 10 ounces at birth.  Blood pressure today at our office is 111/64 mmHg.  Ultrasound The estimated fetal weight is at the 94th percentile and the abdominal circumference measurement at the 99 percentile.  Cephalic presentation.  Amniotic fluid is normal and good fetal activity seen.  I counseled the patient on the limitations of ultrasound and accurately estimating fetal weights.  In the absence of gestational diabetes, we would expect good pregnancy outcomes induction of labor is not recommended before [redacted] weeks gestation for suspected macrosomia.  Recommendations - Follow-up scans as clinically indicated.  Consultation including face-to-face (more than 50%) counseling 10 minutes.

## 2024-02-11 ENCOUNTER — Ambulatory Visit

## 2024-02-12 ENCOUNTER — Encounter (HOSPITAL_COMMUNITY): Payer: Self-pay | Admitting: Obstetrics and Gynecology

## 2024-02-12 ENCOUNTER — Inpatient Hospital Stay (HOSPITAL_COMMUNITY)
Admission: AD | Admit: 2024-02-12 | Discharge: 2024-02-13 | Disposition: A | Discharge status: Home / Self Care | Attending: Obstetrics and Gynecology | Admitting: Obstetrics and Gynecology

## 2024-02-12 DIAGNOSIS — O23593 Infection of other part of genital tract in pregnancy, third trimester: Secondary | ICD-10-CM | POA: Insufficient documentation

## 2024-02-12 DIAGNOSIS — N760 Acute vaginitis: Secondary | ICD-10-CM

## 2024-02-12 DIAGNOSIS — B9689 Other specified bacterial agents as the cause of diseases classified elsewhere: Secondary | ICD-10-CM | POA: Insufficient documentation

## 2024-02-12 DIAGNOSIS — Z3A36 36 weeks gestation of pregnancy: Secondary | ICD-10-CM | POA: Insufficient documentation

## 2024-02-12 DIAGNOSIS — O4703 False labor before 37 completed weeks of gestation, third trimester: Secondary | ICD-10-CM | POA: Insufficient documentation

## 2024-02-12 DIAGNOSIS — O471 False labor at or after 37 completed weeks of gestation: Secondary | ICD-10-CM

## 2024-02-12 NOTE — MAU Note (Signed)
 Pt says on Wed - she had sex - then felt a gush of fluid run out.   No other fluid since . Feels UC's -  PNC- [facility] VE- on Monday- 1-2 cm  Denies HSV GBS- neg

## 2024-02-13 DIAGNOSIS — N760 Acute vaginitis: Secondary | ICD-10-CM

## 2024-02-13 DIAGNOSIS — Z3A36 36 weeks gestation of pregnancy: Secondary | ICD-10-CM | POA: Diagnosis not present

## 2024-02-13 DIAGNOSIS — O471 False labor at or after 37 completed weeks of gestation: Secondary | ICD-10-CM | POA: Diagnosis not present

## 2024-02-13 DIAGNOSIS — B9689 Other specified bacterial agents as the cause of diseases classified elsewhere: Secondary | ICD-10-CM

## 2024-02-13 DIAGNOSIS — O23593 Infection of other part of genital tract in pregnancy, third trimester: Secondary | ICD-10-CM | POA: Diagnosis not present

## 2024-02-13 DIAGNOSIS — O4703 False labor before 37 completed weeks of gestation, third trimester: Secondary | ICD-10-CM | POA: Diagnosis not present

## 2024-02-13 LAB — WET PREP, GENITAL
Sperm: NONE SEEN
Trich, Wet Prep: NONE SEEN
WBC, Wet Prep HPF POC: >=10 — AB (ref <10)
Yeast Wet Prep HPF POC: NONE SEEN

## 2024-02-13 MED ORDER — METRONIDAZOLE 500 MG PO TABS: 500.0000 mg | ORAL_TABLET | Freq: Two times a day (BID) | ORAL | 0 refills | Status: DC

## 2024-02-13 NOTE — MAU Provider Note (Signed)
 Chief Complaint:  Rupture of Membranes and Contractions   HPI    Charlotte Cohen is a 23 y.o. G2P0101 at [redacted]w[redacted]d who presents to maternity admissions reporting she was concerned about LOF. She reported she had sex on Wed and afterwards noticed LOF. Denies any active LOF, VB, reports occasional CTX's and reports good FM's.   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   WISDOM TOOTH EXTRACTION  2021   four;   Family History  Problem Relation Age of Onset   Depression Mother    Anxiety disorder Mother    Healthy Mother    Bipolar disorder Mother    Bipolar disorder Father    Depression Father    Anxiety disorder Father    Healthy Father    Healthy Sister    Healthy Sister    Healthy Brother    Healthy Brother    Healthy Brother    Eczema Brother    Cancer Maternal Grandmother        vaginal/uterine   Eczema Maternal Grandfather    Eczema Paternal Grandmother    Eczema Paternal Grandfather    Social History   Tobacco Use   Smoking status: Never   Smokeless tobacco: Never  Vaping Use   Vaping status: Never Used  Substance Use Topics   Alcohol use: Never   Drug use: Never   No Known Allergies No medications prior to admission.    I have reviewed patient's Past Medical Hx, Surgical Hx, Family Hx, Social Hx, medications and allergies.   ROS  Pertinent items noted in HPI and remainder of comprehensive ROS otherwise negative.   PHYSICAL EXAM   Patient Vitals for the past 24  hrs:  BP Temp Temp src Pulse Resp SpO2 Weight  02/13/24 0052 117/60 -- -- 97 -- -- --  02/13/24 0000 117/69 -- -- 95 -- 97 % --  02/12/24 2348 120/64 -- -- (!) 103 -- 97 % --  02/12/24 2333 117/68 98.1 F (36.7 C) Oral (!) 103 14 -- 102.2 kg    Constitutional: Well-developed, obese female in no acute distress.  Cardiovascular: tachycardia, warm and well-perfused Respiratory: normal effort, no problems with respiration noted GI: Abd soft, non-tender, gravid MS: Extremities nontender, no edema, normal ROM Neurologic: Alert and oriented x 4.  GU: no CVA tenderness Pelvic exam chaperoned by Rockne Chyle RN SSE: No pooling, mucoid discharge noted, no evidence of vaginal bleeding , and cervix is visually closed   Dilation: Closed Effacement (%): Thick Exam by:: Robertt Buda, NP  Fetal Tracing: Baseline: 130 Variability: moderate  Accelerations: present Decelerations: absent Toco: irregular    Labs: Results for orders placed or performed during the hospital encounter of 02/12/24 (from the past 24 hours)  Wet prep, genital     Status: Abnormal   Collection  Time: 02/13/24 12:12 AM  Result Value Ref Range   Yeast Wet Prep HPF POC NONE SEEN NONE SEEN   Trich, Wet Prep NONE SEEN NONE SEEN   Clue Cells Wet Prep HPF POC PRESENT (A) NONE SEEN   WBC, Wet Prep HPF POC >=10 (A) <10   Sperm NONE SEEN     Imaging:  No results found.  MDM & MAU COURSE  MDM:  HIGH  Fern test: Negative ( Verbal Report from [facility] RN) Wet Prep and CX obtained ( CW BV- Will send RX at discharge)  SSE: No evidence of ROM and Cervix L/C on exam NST for GA and fetal well being No evidence of labor ( Cervix Long/Closed)  MAU Course: Orders Placed This Encounter  Procedures   Wet prep, genital   Fern Test   Discharge patient Discharge disposition: 01-Home or Self Care; Discharge patient date: 02/13/2024   I have reviewed the patient chart and performed the physical exam . I have ordered &  interpreted the lab results and reviewed and interpreted the NST Medications ordered as stated below.  A/P as described below.  Counseling and education provided and patient agreeable  with plan as described below. Verbalized understanding.    ASSESSMENT   1. Bacterial vaginosis   2. [redacted] weeks gestation of pregnancy   3. False labor after 37 completed weeks of gestation     PLAN  Discharge home in stable condition with return precautions.   F/U with OB   See AVS for full description of information given to the patient including both verbal and written. Patient verbalized understanding and agrees with the plan as described above.    Follow-up Information     [facility] Follow up.   Specialty: Obstetrics and Gynecology Why: If symptoms worsen or fail to resolve, As scheduled for ongoing prenatal care Contact information: [address], [address] [place]   [address] [phone]                Allergies as of 02/13/2024   No Known Allergies      Medication List     TAKE these medications    acetaminophen  325 MG tablet Commonly known as: Tylenol  Take 2 tablets (650 mg total) by mouth every 4 (four) hours as needed (for pain scale < 4).   albuterol  (2.5 MG/3ML) 0.083% nebulizer solution Commonly known as: PROVENTIL  Take 3 mLs (2.5 mg total) by nebulization every 4 (four) hours as needed for wheezing or shortness of breath.   albuterol  108 (90 Base) MCG/ACT inhaler Commonly known as: VENTOLIN  HFA Inhale 2 puffs into the lungs every 6 (six) hours as needed for wheezing or shortness of breath.   albuterol  (2.5 MG/3ML) 0.083% nebulizer solution Commonly known as: PROVENTIL  Take 3 mLs (2.5 mg total) by nebulization every 6 (six) hours as needed for wheezing or shortness of breath.   Blood Pressure Kit Devi 1 Device by Does not apply route once a week.   budesonide  180 MCG/ACT inhaler Commonly known as:  PULMICORT  Inhale 1 puff into the lungs 2 (two) times daily.   cetirizine  10 MG tablet Commonly known as: ZyrTEC  Allergy Take 1 tablet (10 mg total) by mouth daily.   fluticasone  50 MCG/ACT nasal spray Commonly known as: FLONASE  Place 2 sprays into both nostrils daily.   metroNIDAZOLE  500 MG tablet Commonly known as: FLAGYL  Take 1 tablet (500 mg total) by mouth 2 (two) times daily.  montelukast  10 MG tablet Commonly known as: SINGULAIR  Take 1 tablet (10 mg total) by mouth at bedtime.   Prenatal Plus Vitamin/Mineral 27-1 MG Tabs Take 1 tablet by mouth daily.   terconazole  0.4 % vaginal cream Commonly known as: TERAZOL 7  Place 1 applicator vaginally at bedtime. Use for seven days        Debbe Fail, MSN, [facility], Center for [employer]

## 2024-02-15 LAB — GC/CHLAMYDIA PROBE AMP (~~LOC~~) NOT AT ARMC
Chlamydia: NEGATIVE
Comment: NEGATIVE
Comment: NORMAL
Neisseria Gonorrhea: NEGATIVE

## 2024-02-16 ENCOUNTER — Ambulatory Visit (INDEPENDENT_AMBULATORY_CARE_PROVIDER_SITE_OTHER): Admitting: Obstetrics and Gynecology

## 2024-02-16 ENCOUNTER — Ambulatory Visit

## 2024-02-16 VITALS — BP 116/78 | HR 86 | Wt 224.0 lb

## 2024-02-16 DIAGNOSIS — J4520 Mild intermittent asthma, uncomplicated: Secondary | ICD-10-CM | POA: Diagnosis not present

## 2024-02-16 DIAGNOSIS — Z3A37 37 weeks gestation of pregnancy: Secondary | ICD-10-CM

## 2024-02-16 DIAGNOSIS — O0990 Supervision of high risk pregnancy, unspecified, unspecified trimester: Secondary | ICD-10-CM | POA: Diagnosis not present

## 2024-02-16 DIAGNOSIS — R87610 Atypical squamous cells of undetermined significance on cytologic smear of cervix (ASC-US): Secondary | ICD-10-CM

## 2024-02-16 DIAGNOSIS — R87810 Cervical high risk human papillomavirus (HPV) DNA test positive: Secondary | ICD-10-CM | POA: Diagnosis not present

## 2024-02-16 DIAGNOSIS — Z6839 Body mass index (BMI) 39.0-39.9, adult: Secondary | ICD-10-CM

## 2024-02-16 DIAGNOSIS — Z8632 Personal history of gestational diabetes: Secondary | ICD-10-CM

## 2024-02-16 DIAGNOSIS — O3663X0 Maternal care for excessive fetal growth, third trimester, not applicable or unspecified: Secondary | ICD-10-CM

## 2024-02-16 DIAGNOSIS — Z3009 Encounter for other general counseling and advice on contraception: Secondary | ICD-10-CM | POA: Diagnosis not present

## 2024-02-16 NOTE — Progress Notes (Signed)
   PRENATAL VISIT NOTE  Subjective:  Charlotte Cohen is a 23 y.o. G2P0101 at [redacted]w[redacted]d being seen today for ongoing prenatal care.  She is currently monitored for the following issues for this low-risk pregnancy and has Asthma; ASCUS with positive high risk HPV cervical; Supervision of high risk pregnancy, antepartum; Maternal varicella, non-immune; Enlarged ovary; History of gestational diabetes; Obesity affecting pregnancy; Unwanted fertility; and Yeast infection on their problem list.  Patient reports congestion, morning nausea.  Contractions: Irregular. Vag. Bleeding: None.  Movement: Present. Denies leaking of fluid.   The following portions of the patient's history were reviewed and updated as appropriate: allergies, current medications, past family history, past medical history, past social history, past surgical history and problem list.   Objective:   Vitals:   02/16/24 1112  BP: 116/78  Pulse: 86  Weight: 224 lb (101.6 kg)    Fetal Status: Fetal Heart Rate (bpm): 160   Movement: Present     General:  Alert, oriented and cooperative. Patient is in no acute distress.  Skin: Skin is warm and dry. No rash noted.   Cardiovascular: Normal heart rate noted  Respiratory: Normal respiratory effort, no problems with respiration noted  Abdomen: Soft, gravid, appropriate for gestational age.  Pain/Pressure: Present     SCE 2.5/60/-3 - chaperone present  Assessment and Plan:  Pregnancy: G2P0101 at [redacted]w[redacted]d 1. Supervision of high risk pregnancy, antepartum (Primary) 2. [redacted] weeks gestation of pregnancy Discussed pepcid  for reflux  3. Unwanted fertility Papers signed 12/08/23 Previously discussed that immediate PP tubal may not be possible due to body habitus or scheduling issues  4. BMI 39.0-39.9,adult 5. History of gestational diabetes 8. LGA fetus Normal 2h @36 /1 3394g (94%), AC >99%, 14.3, anterior, cephalic  6. ASCUS with positive high risk HPV cervical PP pap  7. Mild  intermittent asthma without complication No current respiratory complaints  Term labor symptoms and general obstetric precautions including but not limited to vaginal bleeding, contractions, leaking of fluid and fetal movement were reviewed in detail with the patient.  Please refer to After Visit Summary for other counseling recommendations.   Return in about 1 week (around 02/23/2024) for return OB at 38 weeks.  Future Appointments  Date Time Provider Department Center  02/24/2024  8:35 AM Davis, Devon E, PA-C [facility] None   Izell Marsh, MD

## 2024-02-16 NOTE — Progress Notes (Signed)
 Asking to have here cervix checked. Went to ER and told closed but told here that she was 11/2cm dilated. Also pelvic pressure.

## 2024-02-17 ENCOUNTER — Inpatient Hospital Stay (HOSPITAL_COMMUNITY)

## 2024-02-17 ENCOUNTER — Encounter (HOSPITAL_COMMUNITY): Payer: Self-pay | Admitting: Obstetrics & Gynecology

## 2024-02-17 ENCOUNTER — Inpatient Hospital Stay (HOSPITAL_COMMUNITY)
Admission: AD | Admit: 2024-02-17 | Discharge: 2024-02-17 | Disposition: A | Discharge status: Home / Self Care | Payer: Self-pay | Attending: Obstetrics & Gynecology | Admitting: Obstetrics & Gynecology

## 2024-02-17 DIAGNOSIS — R109 Unspecified abdominal pain: Secondary | ICD-10-CM | POA: Diagnosis not present

## 2024-02-17 DIAGNOSIS — O0990 Supervision of high risk pregnancy, unspecified, unspecified trimester: Secondary | ICD-10-CM

## 2024-02-17 DIAGNOSIS — O0993 Supervision of high risk pregnancy, unspecified, third trimester: Secondary | ICD-10-CM | POA: Insufficient documentation

## 2024-02-17 DIAGNOSIS — K8070 Calculus of gallbladder and bile duct without cholecystitis without obstruction: Secondary | ICD-10-CM

## 2024-02-17 DIAGNOSIS — K8020 Calculus of gallbladder without cholecystitis without obstruction: Secondary | ICD-10-CM | POA: Insufficient documentation

## 2024-02-17 DIAGNOSIS — K810 Acute cholecystitis: Secondary | ICD-10-CM | POA: Diagnosis not present

## 2024-02-17 DIAGNOSIS — O26613 Liver and biliary tract disorders in pregnancy, third trimester: Secondary | ICD-10-CM

## 2024-02-17 DIAGNOSIS — O471 False labor at or after 37 completed weeks of gestation: Secondary | ICD-10-CM | POA: Insufficient documentation

## 2024-02-17 DIAGNOSIS — O99613 Diseases of the digestive system complicating pregnancy, third trimester: Secondary | ICD-10-CM | POA: Insufficient documentation

## 2024-02-17 DIAGNOSIS — Z3A37 37 weeks gestation of pregnancy: Secondary | ICD-10-CM

## 2024-02-17 DIAGNOSIS — O26899 Other specified pregnancy related conditions, unspecified trimester: Secondary | ICD-10-CM

## 2024-02-17 LAB — CBC
HCT: 35.4 % — ABNORMAL LOW (ref 36.0–46.0)
Hemoglobin: 11.8 g/dL — ABNORMAL LOW (ref 12.0–15.0)
MCH: 30.6 pg (ref 26.0–34.0)
MCHC: 33.3 g/dL (ref 30.0–36.0)
MCV: 91.7 fL (ref 80.0–100.0)
Platelets: 145 10*3/uL — ABNORMAL LOW (ref 150–400)
RBC: 3.86 MIL/uL — ABNORMAL LOW (ref 3.87–5.11)
RDW: 13.8 % (ref 11.5–15.5)
WBC: 9.2 10*3/uL (ref 4.0–10.5)
nRBC: 0 % (ref 0.0–0.2)

## 2024-02-17 LAB — COMPREHENSIVE METABOLIC PANEL WITH GFR
ALT: 26 U/L (ref 0–44)
AST: 46 U/L — ABNORMAL HIGH (ref 15–41)
Albumin: 2.6 g/dL — ABNORMAL LOW (ref 3.5–5.0)
Alkaline Phosphatase: 96 U/L (ref 38–126)
Anion gap: 8 (ref 5–15)
BUN: 6 mg/dL (ref 6–20)
CO2: 21 mmol/L — ABNORMAL LOW (ref 22–32)
Calcium: 8.8 mg/dL — ABNORMAL LOW (ref 8.9–10.3)
Chloride: 105 mmol/L (ref 98–111)
Creatinine, Ser: 0.62 mg/dL (ref 0.44–1.00)
GFR, Estimated: >60 mL/min (ref >60)
Glucose, Bld: 90 mg/dL (ref 70–99)
Potassium: 3.6 mmol/L (ref 3.5–5.1)
Sodium: 134 mmol/L — ABNORMAL LOW (ref 135–145)
Total Bilirubin: 0.9 mg/dL (ref 0.0–1.2)
Total Protein: 5.9 g/dL — ABNORMAL LOW (ref 6.5–8.1)

## 2024-02-17 LAB — POCT FERN TEST: POCT Fern Test: NEGATIVE

## 2024-02-17 MED ORDER — HYDROMORPHONE HCL 1 MG/ML IJ SOLN
1.0000 mg | Freq: Once | INTRAMUSCULAR | Status: AC
  Administered 2024-02-17: 1 mg via INTRAMUSCULAR
  Filled 2024-02-17: qty 1

## 2024-02-17 MED ORDER — OXYCODONE-ACETAMINOPHEN 5-325 MG PO TABS: 1.0000 | ORAL_TABLET | Freq: Four times a day (QID) | ORAL | 0 refills | Status: DC | PRN

## 2024-02-17 MED ORDER — OXYCODONE HCL 5 MG PO TABS
5.0000 mg | ORAL_TABLET | Freq: Once | ORAL | Status: AC
  Administered 2024-02-17: 5 mg via ORAL
  Filled 2024-02-17: qty 1

## 2024-02-17 MED ORDER — CYCLOBENZAPRINE HCL 5 MG PO TABS
10.0000 mg | ORAL_TABLET | Freq: Once | ORAL | Status: AC
  Administered 2024-02-17: 10 mg via ORAL
  Filled 2024-02-17: qty 2

## 2024-02-17 NOTE — MAU Note (Signed)
 Charlotte Cohen is a 23 y.o. at [redacted]w[redacted]d here in MAU reporting ctxs since 0200 that have become stronger. Reports good FM and denies LOF or VB. Feeling ctxs a lot in her lower back  LMP: n/a Onset of complaint: 0200 Pain score: 10 Vitals:   02/17/24 0637 02/17/24 0638  BP:  125/71  Pulse: 100   Resp: 18   Temp: 97.7 F (36.5 C)   SpO2: 99%      FHT: 134  Lab orders placed from triage: labor

## 2024-02-17 NOTE — MAU Provider Note (Addendum)
 History     [id number]  Arrival date and time: 02/17/24 4098    Chief Complaint  Patient presents with   Contractions     HPI Charlotte Cohen is a 23 y.o. at [redacted]w[redacted]d by 8 wk US  with PMHx notable for asthma, cholelithiasis, who presents for contractions.   Patient reports worsening contractions that started early this morning Has been having pain in her abdomen but also in a band across the top of her belly Did not start with eating food Has also been having intense pain in her back, feels like she can't get comfortable or fall asleep even No leaking fluid or bleeding Fetal movement is normal   O/Positive/-- (12/10 1438)  OB History     Gravida  2   Para  1   Term  0   Preterm  1   AB  0   Living  1      SAB  0   IAB  0   Ectopic  0   Multiple  0   Live Births  1           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   WISDOM TOOTH EXTRACTION  2021   four;    Family History  Problem Relation Age of Onset   Depression Mother    Anxiety disorder Mother    Healthy Mother    Bipolar disorder Mother    Bipolar disorder Father    Depression Father    Anxiety disorder Father    Healthy Father    Healthy Sister    Healthy Sister    Healthy Brother    Healthy Brother    Healthy Brother    Eczema Brother    Cancer Maternal Grandmother        vaginal/uterine   Eczema Maternal Grandfather    Eczema Paternal Grandmother    Eczema Paternal Grandfather     Social History   Socioeconomic History   Marital status: Married    Spouse name: Luis   Number of children: 0   Years of education: 12   Highest education level: Not on file  Occupational History   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  Tobacco Use   Smoking status: Never   Smokeless tobacco: Never  Vaping Use   Vaping  status: Never Used  Substance and Sexual Activity   Alcohol use: Never   Drug use: Never   Sexual activity: Yes    Partners: Male    Birth control/protection: None  Other Topics Concern   Not on file  Social History Narrative   Not on file   Social Drivers of Health   Financial Resource Strain: Low Risk  (04/03/2023)   Received from [facility]   Overall Financial Resource Strain (CARDIA)    Difficulty of Paying Living Expenses: Not hard at all  Food Insecurity: No Food Insecurity (11/27/2023)   Hunger Vital Sign    Worried About Running Out of Food in the Last Year: Never true    Ran Out of Food in the Last Year: Never true  Transportation Needs: No Transportation Needs (11/27/2023)   PRAPARE - Administrator, Civil Service (Medical): No    Lack of Transportation (Non-Medical): No  Physical Activity:  Insufficiently Active (07/18/2022)   Exercise Vital Sign    Days of Exercise per Week: 5 days    Minutes of Exercise per Session: 20 min  Stress: Patient Declined (04/03/2023)   Received from [facility] - Occupational Stress Questionnaire    Feeling of Stress : Patient declined  Social Connections: Moderately Integrated (04/03/2023)   Received from [facility]   Social Network    How would you rate your social network (family, work, friends)?: Adequate participation with social networks  Intimate Partner Violence: Not At Risk (11/27/2023)   Humiliation, Afraid, Rape, and Kick questionnaire    Fear of Current or Ex-Partner: No    Emotionally Abused: No    Physically Abused: No    Sexually Abused: No    No Known Allergies  No current facility-administered medications on file prior to encounter.   Current Outpatient Medications on File Prior to Encounter  Medication Sig Dispense Refill   budesonide  (PULMICORT ) 180 MCG/ACT inhaler Inhale 1 puff into the lungs 2 (two) times daily. 1 each 2   cetirizine  (ZYRTEC  ALLERGY)  10 MG tablet Take 1 tablet (10 mg total) by mouth daily. 30 tablet 11   fluticasone  (FLONASE ) 50 MCG/ACT nasal spray Place 2 sprays into both nostrils daily. 16 g 2   montelukast  (SINGULAIR ) 10 MG tablet Take 1 tablet (10 mg total) by mouth at bedtime. 30 tablet 3   Prenatal Vit-Fe Fumarate-FA (PRENATAL PLUS VITAMIN/MINERAL) 27-1 MG TABS Take 1 tablet by mouth daily. 30 tablet 12   terconazole  (TERAZOL 7 ) 0.4 % vaginal cream Place 1 applicator vaginally at bedtime. Use for seven days 45 g 0   acetaminophen  (TYLENOL ) 325 MG tablet Take 2 tablets (650 mg total) by mouth every 4 (four) hours as needed (for pain scale < 4). 30 tablet 1   albuterol  (PROVENTIL ) (2.5 MG/3ML) 0.083% nebulizer solution Take 3 mLs (2.5 mg total) by nebulization every 4 (four) hours as needed for wheezing or shortness of breath. 75 mL 3   albuterol  (PROVENTIL ) (2.5 MG/3ML) 0.083% nebulizer solution Take 3 mLs (2.5 mg total) by nebulization every 6 (six) hours as needed for wheezing or shortness of breath. 75 mL 12   albuterol  (VENTOLIN  HFA) 108 (90 Base) MCG/ACT inhaler Inhale 2 puffs into the lungs every 6 (six) hours as needed for wheezing or shortness of breath. 18 g 2   Blood Pressure Monitoring (BLOOD PRESSURE KIT) DEVI 1 Device by Does not apply route once a week. (Patient not taking: Reported on 02/16/2024) 1 each 0   metroNIDAZOLE  (FLAGYL ) 500 MG tablet Take 1 tablet (500 mg total) by mouth 2 (two) times daily. (Patient not taking: Reported on 02/16/2024) 14 tablet 0     ROS Pertinent positives and negative per HPI, all others reviewed and negative  Physical Exam   BP 125/71   Pulse 100   Temp 97.7 F (36.5 C)   Resp 18   Ht 5\' 3"  (1.6 m)   Wt 102.5 kg   LMP 05/22/2023   SpO2 99%   BMI 40.03 kg/m   Patient Vitals for the past 24 hrs:  BP Temp Pulse Resp SpO2 Height Weight  02/17/24 0638 125/71 -- -- -- -- -- --  02/17/24 0637 -- 97.7 F (36.5 C) 100 18 99 % 5\' 3"  (1.6 m) 102.5 kg    Physical  Exam Vitals reviewed.  Constitutional:      General: She is not in acute distress.  Appearance: She is well-developed. She is not diaphoretic.  Eyes:     General: No scleral icterus. Pulmonary:     Effort: Pulmonary effort is normal. No respiratory distress.  Abdominal:     General: There is no distension.     Palpations: Abdomen is soft.     Tenderness: There is abdominal tenderness. There is no guarding or rebound.     Comments: Gravid abdomen, diffusely mildly tender to palpation, neg murphy sign  Skin:    General: Skin is warm and dry.  Neurological:     Mental Status: She is alert.     Coordination: Coordination normal.      Cervical Exam Dilation: 1.5 Effacement (%): 60 Cervical Position: Posterior Station: -3 Exam by:: K.Wilson,RN  Bedside Ultrasound Not performed.  My interpretation: n/a  FHT Baseline: 135 bpm Variability: Good {> 6 bpm) Accelerations: Reactive Decelerations: Absent Uterine activity: mostly irritability with rare contractions Cat: I  Labs No results found for this or any previous visit (from the past 24 hours).  Imaging No results found.  MAU Course  Procedures Lab Orders         Comprehensive metabolic panel with GFR         CBC         POCT fern test    Meds ordered this encounter  Medications   oxyCODONE  (Oxy IR/ROXICODONE ) immediate release tablet 5 mg    Refill:  0   cyclobenzaprine  (FLEXERIL ) tablet 10 mg   Imaging Orders         US  ABDOMEN LIMITED RUQ (LIVER/GB)     MDM Moderate (Level 3-4)  Assessment and Plan  #Prodomal labor, Abdominal pain in pregnancy, third trimester #[redacted] weeks gestation of pregnancy Patient presenting with what appears to be likely prodromal labor. Low suspicion for gallbladder etiology but given location of pain will work up with CBC/CMP and RUQ US  to be sure. Regarding prodromal labor patient is quite miserable. Discussed with her that we can not electively induce prior to 39 weeks but we  can trial some medications to see if we can get her more comfortable, ordered for flexeril  and oxycodone .   #FWB FHT Cat I NST: Reactive   Dispo: pending above workup, care signed over to [facility] at change of shift     Teena Feast, MD/MPH 02/17/24 8:03 AM  MDM:  US  results:  US  ABDOMEN LIMITED RUQ (LIVER/GB) Result Date: 02/17/2024 CLINICAL DATA:  [id number] Abdominal pain [id number] EXAM: ULTRASOUND ABDOMEN LIMITED RIGHT UPPER QUADRANT COMPARISON:  November 27, 2023 FINDINGS: Gallbladder: Numerous small layering gallstones. No wall thickening or pericholecystic fluid. No sonographic Murphy's sign noted by sonographer. Common bile duct: Diameter: 5 mm Liver: Normal echogenicity. No focal lesion identified. No intrahepatic biliary ductal dilation. Portal vein is patent on color Doppler imaging with normal direction of blood flow towards the liver. Other: None. IMPRESSION: Numerous small layering gallstones. No changes of acute cholecystitis. Electronically Signed   By: Rance Burrows M.D.   On: 02/17/2024 08:55     US  c/w prior studies with cholelithiasis without cholecystitis.  Cervix unchanged in several hours in MAU so unlikely active labor.  Given that pain is mostly upper abdomen and gallstones are present, pt pain is likely an exacerbation of cholelithiasis.  Reviewed low fat diet with pt and her husband today.  Since pain was not relieved by medications given earlier, Dilaudid 1 mg IM given x 1 in MAU and Rx at discharge for Percocet 5/325, take 1-2  Q 6 hours PRN x 10 tablets.  Pt to follow strict diet, keep scheduled appt next week at [facility], and return to MAU as needed for worsening symptoms or signs of labor.     A/P: 1. Supervision of high risk pregnancy, antepartum   2. Cholelithiasis during pregnancy   3. Abdominal pain affecting pregnancy   4. [redacted] weeks gestation of pregnancy      D/C home  Arlester Bence, CNM 10:05 AM

## 2024-02-22 NOTE — Progress Notes (Unsigned)
 PRENATAL VISIT NOTE  Subjective:  Charlotte Cohen is a 23 y.o. G2P0101 at [redacted]w[redacted]d being seen today for ongoing prenatal care.  She is currently monitored for the following issues for this high-risk pregnancy and has Asthma; ASCUS with positive high risk HPV cervical; Supervision of high risk pregnancy, antepartum; Maternal varicella, non-immune; Enlarged ovary; History of gestational diabetes; Obesity affecting pregnancy; Unwanted fertility; and Yeast infection on their problem list.  #Cholelithiasis Patient seen 02/17/24 at MAU with diffuse abdominal tenderness. Imaging studies revealed cholelithiasis without evidence of cholecystitis. NST reactive. Patient given pain control and discharge. Since then, still having intermittent RUQ pain that resolves in between episodes. Is improved with discharge medication that was given at discharge from MAU, is out of them now. Would like to have her gall bladder removed if possible.   #Asthma  Daily SOB, coughing, wheezing, mucus.   Nighttime symptoms twice nightly, every night  Using albuterol  only.   Contractions: Irritability. Vag. Bleeding: None.  Movement: Present. Denies leaking of fluid.   The following portions of the patient's history were reviewed and updated as appropriate: allergies, current medications, past family history, past medical history, past social history, past surgical history and problem list.   Objective:    Vitals:   02/24/24 0835  BP: 116/73  Pulse: 94  Weight: 219 lb 12.8 oz (99.7 kg)    Fetal Status:  Fetal Heart Rate (bpm): 149 Fundal Height: 36 cm Movement: Present    General: Alert, oriented and cooperative. Patient is in no acute distress.  Skin: Skin is warm and dry. No rash noted.   Cardiovascular: Normal heart rate noted  Respiratory: Normal respiratory effort, no problems with respiration noted  Abdomen: Soft, gravid, appropriate for gestational age.  Pain/Pressure: Present     Pelvic: Cervical  exam deferred        Extremities: Normal range of motion.  Edema: None  Mental Status: Normal mood and affect. Normal behavior. Normal judgment and thought content.   Assessment and Plan:  Pregnancy: G2P0101 at [redacted]w[redacted]d  1. Supervision of high risk pregnancy, antepartum (Primary) Patient doing well, feeling regular fetal movement BP, FHR, FH appropriate  2. [redacted] weeks gestation of pregnancy Anticipatory guidance about next visits/weeks of pregnancy given.   3. BMI 39.0-39.9,adult 4. History of gestational diabetes 5. Excessive fetal growth affecting management of pregnancy in third trimester, single or unspecified fetus 02/10/24 EFW 3394 gm (94 %), AC 99%, AFI WNL  6. Cholelithiasis during pregnancy Intermittently symptomatic, pain well-controlled on oxy-acetaminophen  5-325 mg for 7/10 pain. Well-appearing, afebrile, negative Murphy's. Rx oxy-5 to tie over until delivery at 39 weeks.  -IOL scheduled 03/01/24 -Ambulatory referral to General Surgery -oxyCODONE  (OXY IR/ROXICODONE ) 5 MG immediate release tablet; Take 1 tablet (5 mg total) by mouth every 6 (six) hours as needed for up to 6 days for severe pain (pain score 7-10) or breakthrough pain.  Dispense: 12 tablet; Refill: 0  7. Mild intermittent asthma  Uncontrolled; Daily cough, wheezing, shortness of breath. Nighttime awakenings every day. Currently using albuterol  only as needed. Patient instructed to start ICS-formoterol inhaler as needed for controller and rescue therapy; will increase to daily if remains uncontrolled. Advised that since at the end of pregnancy, should be followed closely with PCP.  - budesonide -formoterol (SYMBICORT) 80-4.5 MCG/ACT inhaler; Inhale 2 puffs into the lungs as needed (for cough, shortness of breath, wheezing).  Dispense: 1 each; Refill: 12   Preterm labor symptoms and general obstetric precautions including but not limited to vaginal  bleeding, contractions, leaking of fluid and fetal movement were reviewed  in detail with the patient.  Please refer to After Visit Summary for other counseling recommendations.   No follow-ups on file.  Future Appointments  Date Time Provider Department Center  03/01/2024  6:45 AM [facility] SCHED ROOM [facility] None  03/02/2024  8:35 AM Zelma Hidden, FNP [facility] None     Luevenia Saha, PA-C

## 2024-02-23 ENCOUNTER — Encounter

## 2024-02-24 ENCOUNTER — Ambulatory Visit: Admitting: Physician Assistant

## 2024-02-24 VITALS — BP 116/73 | HR 94 | Wt 219.8 lb

## 2024-02-24 DIAGNOSIS — O3663X0 Maternal care for excessive fetal growth, third trimester, not applicable or unspecified: Secondary | ICD-10-CM | POA: Diagnosis not present

## 2024-02-24 DIAGNOSIS — J4520 Mild intermittent asthma, uncomplicated: Secondary | ICD-10-CM | POA: Diagnosis not present

## 2024-02-24 DIAGNOSIS — Z6839 Body mass index (BMI) 39.0-39.9, adult: Secondary | ICD-10-CM

## 2024-02-24 DIAGNOSIS — O0990 Supervision of high risk pregnancy, unspecified, unspecified trimester: Secondary | ICD-10-CM

## 2024-02-24 DIAGNOSIS — Z8632 Personal history of gestational diabetes: Secondary | ICD-10-CM | POA: Diagnosis not present

## 2024-02-24 DIAGNOSIS — K8070 Calculus of gallbladder and bile duct without cholecystitis without obstruction: Secondary | ICD-10-CM

## 2024-02-24 DIAGNOSIS — O26619 Liver and biliary tract disorders in pregnancy, unspecified trimester: Secondary | ICD-10-CM | POA: Diagnosis not present

## 2024-02-24 DIAGNOSIS — Z3A38 38 weeks gestation of pregnancy: Secondary | ICD-10-CM

## 2024-02-24 MED ORDER — BUDESONIDE-FORMOTEROL FUMARATE 80-4.5 MCG/ACT IN AERO: 2.0000 | INHALATION_SPRAY | RESPIRATORY_TRACT | 12 refills | Status: DC | PRN

## 2024-02-24 MED ORDER — OXYCODONE HCL 5 MG PO TABS: 5.0000 mg | ORAL_TABLET | Freq: Four times a day (QID) | ORAL | 0 refills | Status: DC | PRN

## 2024-02-24 NOTE — Progress Notes (Signed)
 Pt presents for rob. Pt has no questions or concerns at this time.

## 2024-02-24 NOTE — Patient Instructions (Signed)
 A new inhaler has been prescribed to you (Symbicort). You should start out using this inhaler as needed for any asthma symptoms you have (cough, shortness of breath, wheezing, etc.). You no longer need to use any other inhalers. Since you are in the last weeks of pregnancy, this is something that should be followed-up on with your primary care provider after delivery.

## 2024-02-24 NOTE — Addendum Note (Signed)
 Addended by: Dhwani Venkatesh on: 02/24/2024 06:57 PM   Modules accepted: Orders

## 2024-02-26 ENCOUNTER — Encounter (HOSPITAL_COMMUNITY): Payer: Self-pay

## 2024-02-26 ENCOUNTER — Telehealth (HOSPITAL_COMMUNITY): Payer: Self-pay | Admitting: *Deleted

## 2024-02-26 ENCOUNTER — Encounter (HOSPITAL_COMMUNITY): Payer: Self-pay | Admitting: *Deleted

## 2024-02-26 NOTE — Telephone Encounter (Signed)
 Preadmission screen

## 2024-03-01 ENCOUNTER — Inpatient Hospital Stay (HOSPITAL_COMMUNITY)
Admission: RE | Admit: 2024-03-01 | Discharge: 2024-03-03 | DRG: 798 | Disposition: A | Discharge status: Home / Self Care | Attending: Obstetrics and Gynecology | Admitting: Obstetrics and Gynecology

## 2024-03-01 ENCOUNTER — Encounter (HOSPITAL_COMMUNITY): Payer: Self-pay | Admitting: Obstetrics & Gynecology

## 2024-03-01 ENCOUNTER — Inpatient Hospital Stay (HOSPITAL_COMMUNITY): Admitting: Anesthesiology

## 2024-03-01 ENCOUNTER — Encounter

## 2024-03-01 ENCOUNTER — Inpatient Hospital Stay (HOSPITAL_COMMUNITY)

## 2024-03-01 ENCOUNTER — Other Ambulatory Visit: Payer: Self-pay

## 2024-03-01 DIAGNOSIS — O3663X0 Maternal care for excessive fetal growth, third trimester, not applicable or unspecified: Secondary | ICD-10-CM | POA: Diagnosis not present

## 2024-03-01 DIAGNOSIS — O09899 Supervision of other high risk pregnancies, unspecified trimester: Secondary | ICD-10-CM

## 2024-03-01 DIAGNOSIS — Z7951 Long term (current) use of inhaled steroids: Secondary | ICD-10-CM | POA: Diagnosis not present

## 2024-03-01 DIAGNOSIS — R87610 Atypical squamous cells of undetermined significance on cytologic smear of cervix (ASC-US): Secondary | ICD-10-CM | POA: Diagnosis present

## 2024-03-01 DIAGNOSIS — K8020 Calculus of gallbladder without cholecystitis without obstruction: Principal | ICD-10-CM | POA: Diagnosis present

## 2024-03-01 DIAGNOSIS — Z903 Acquired absence of stomach [part of]: Secondary | ICD-10-CM

## 2024-03-01 DIAGNOSIS — Z302 Encounter for sterilization: Secondary | ICD-10-CM

## 2024-03-01 DIAGNOSIS — O99210 Obesity complicating pregnancy, unspecified trimester: Secondary | ICD-10-CM | POA: Diagnosis present

## 2024-03-01 DIAGNOSIS — O99214 Obesity complicating childbirth: Secondary | ICD-10-CM | POA: Diagnosis not present

## 2024-03-01 DIAGNOSIS — O9952 Diseases of the respiratory system complicating childbirth: Secondary | ICD-10-CM | POA: Diagnosis not present

## 2024-03-01 DIAGNOSIS — Z3A39 39 weeks gestation of pregnancy: Secondary | ICD-10-CM | POA: Diagnosis not present

## 2024-03-01 DIAGNOSIS — Z79899 Other long term (current) drug therapy: Secondary | ICD-10-CM | POA: Diagnosis not present

## 2024-03-01 DIAGNOSIS — J45909 Unspecified asthma, uncomplicated: Secondary | ICD-10-CM | POA: Diagnosis not present

## 2024-03-01 DIAGNOSIS — Z2839 Other underimmunization status: Secondary | ICD-10-CM

## 2024-03-01 DIAGNOSIS — O0990 Supervision of high risk pregnancy, unspecified, unspecified trimester: Secondary | ICD-10-CM

## 2024-03-01 DIAGNOSIS — Z3A00 Weeks of gestation of pregnancy not specified: Secondary | ICD-10-CM | POA: Diagnosis not present

## 2024-03-01 DIAGNOSIS — O9962 Diseases of the digestive system complicating childbirth: Principal | ICD-10-CM | POA: Diagnosis present

## 2024-03-01 DIAGNOSIS — Z3009 Encounter for other general counseling and advice on contraception: Secondary | ICD-10-CM | POA: Diagnosis present

## 2024-03-01 HISTORY — DX: Obstruction of bile duct: K83.1

## 2024-03-01 LAB — TYPE AND SCREEN
ABO/RH(D): O POS
Antibody Screen: NEGATIVE

## 2024-03-01 LAB — CBC
HCT: 35.5 % — ABNORMAL LOW (ref 36.0–46.0)
Hemoglobin: 11.5 g/dL — ABNORMAL LOW (ref 12.0–15.0)
MCH: 29.6 pg (ref 26.0–34.0)
MCHC: 32.4 g/dL (ref 30.0–36.0)
MCV: 91.3 fL (ref 80.0–100.0)
Platelets: 167 10*3/uL (ref 150–400)
RBC: 3.89 MIL/uL (ref 3.87–5.11)
RDW: 13.8 % (ref 11.5–15.5)
WBC: 7.9 10*3/uL (ref 4.0–10.5)
nRBC: 0 % (ref 0.0–0.2)

## 2024-03-01 LAB — RPR: RPR Ser Ql: NONREACTIVE

## 2024-03-01 MED ORDER — LIDOCAINE-EPINEPHRINE (PF) 2 %-1:200000 IJ SOLN
INTRAMUSCULAR | Status: DC | PRN
  Administered 2024-03-01: 5 mL via EPIDURAL

## 2024-03-01 MED ORDER — OXYCODONE-ACETAMINOPHEN 5-325 MG PO TABS: 2.0000 | ORAL_TABLET | ORAL | Status: DC | PRN

## 2024-03-01 MED ORDER — DIPHENHYDRAMINE HCL 50 MG/ML IJ SOLN: 12.5000 mg | INTRAMUSCULAR | Status: DC | PRN

## 2024-03-01 MED ORDER — TERBUTALINE SULFATE 1 MG/ML IJ SOLN: 0.2500 mg | Freq: Once | INTRAMUSCULAR | Status: DC | PRN

## 2024-03-01 MED ORDER — OXYTOCIN BOLUS FROM INFUSION
333.0000 mL | Freq: Once | INTRAVENOUS | Status: AC
  Administered 2024-03-01: 333 mL via INTRAVENOUS

## 2024-03-01 MED ORDER — PHENYLEPHRINE 80 MCG/ML (10ML) SYRINGE FOR IV PUSH (FOR BLOOD PRESSURE SUPPORT): 80.0000 ug | PREFILLED_SYRINGE | INTRAVENOUS | Status: DC | PRN

## 2024-03-01 MED ORDER — ACETAMINOPHEN 325 MG PO TABS
650.0000 mg | ORAL_TABLET | ORAL | Status: DC | PRN
Start: 2024-03-01 — End: 2024-03-02
  Administered 2024-03-01: 650 mg via ORAL
  Filled 2024-03-01: qty 2

## 2024-03-01 MED ORDER — OXYTOCIN-SODIUM CHLORIDE 30-0.9 UT/500ML-% IV SOLN
1.0000 m[IU]/min | INTRAVENOUS | Status: DC
  Administered 2024-03-01: 2 m[IU]/min via INTRAVENOUS

## 2024-03-01 MED ORDER — EPHEDRINE 5 MG/ML INJ: 10.0000 mg | INTRAVENOUS | Status: DC | PRN

## 2024-03-01 MED ORDER — SOD CITRATE-CITRIC ACID 500-334 MG/5ML PO SOLN: 30.0000 mL | ORAL | Status: DC | PRN

## 2024-03-01 MED ORDER — OXYTOCIN-SODIUM CHLORIDE 30-0.9 UT/500ML-% IV SOLN
2.5000 [IU]/h | INTRAVENOUS | Status: DC
  Administered 2024-03-01: 2.5 [IU]/h via INTRAVENOUS
  Filled 2024-03-01: qty 500

## 2024-03-01 MED ORDER — FLUTICASONE PROPIONATE 50 MCG/ACT NA SUSP
2.0000 | Freq: Every day | NASAL | Status: DC
  Filled 2024-03-01: qty 16

## 2024-03-01 MED ORDER — PSEUDOEPHEDRINE HCL 30 MG PO TABS
30.0000 mg | ORAL_TABLET | Freq: Once | ORAL | Status: DC
  Filled 2024-03-01 (×2): qty 1

## 2024-03-01 MED ORDER — ONDANSETRON HCL 4 MG/2ML IJ SOLN
4.0000 mg | Freq: Four times a day (QID) | INTRAMUSCULAR | Status: DC | PRN
  Administered 2024-03-01: 4 mg via INTRAVENOUS
  Filled 2024-03-01: qty 2

## 2024-03-01 MED ORDER — LIDOCAINE HCL (PF) 1 % IJ SOLN: 30.0000 mL | INTRAMUSCULAR | Status: DC | PRN

## 2024-03-01 MED ORDER — FENTANYL-BUPIVACAINE-NACL 0.5-0.125-0.9 MG/250ML-% EP SOLN
12.0000 mL/h | EPIDURAL | Status: DC | PRN
  Administered 2024-03-01: 12 mL/h via EPIDURAL
  Filled 2024-03-01: qty 250

## 2024-03-01 MED ORDER — OXYCODONE-ACETAMINOPHEN 5-325 MG PO TABS: 1.0000 | ORAL_TABLET | ORAL | Status: DC | PRN

## 2024-03-01 MED ORDER — PSEUDOEPHEDRINE HCL ER 120 MG PO TB12: 120.0000 mg | ORAL_TABLET | Freq: Two times a day (BID) | ORAL | Status: DC

## 2024-03-01 MED ORDER — BUPIVACAINE HCL (PF) 0.25 % IJ SOLN
INTRAMUSCULAR | Status: DC | PRN
  Administered 2024-03-01 (×2): 5 mL via EPIDURAL

## 2024-03-01 MED ORDER — OXYTOCIN-SODIUM CHLORIDE 30-0.9 UT/500ML-% IV SOLN
1.0000 m[IU]/min | INTRAVENOUS | Status: DC
  Filled 2024-03-01: qty 500

## 2024-03-01 MED ORDER — LACTATED RINGERS IV SOLN: 500.0000 mL | INTRAVENOUS | Status: DC | PRN

## 2024-03-01 MED ORDER — FLEET ENEMA RE ENEM: 1.0000 | ENEMA | RECTAL | Status: DC | PRN

## 2024-03-01 MED ORDER — LACTATED RINGERS IV SOLN: INTRAVENOUS | Status: DC

## 2024-03-01 MED ORDER — LACTATED RINGERS IV SOLN: 500.0000 mL | Freq: Once | INTRAVENOUS | Status: DC

## 2024-03-01 NOTE — H&P (Incomplete)
 LABOR AND DELIVERY ADMISSION HISTORY AND PHYSICAL NOTE  Charlotte Cohen is a 23 y.o. female G2P0101 with IUP at [redacted]w[redacted]d presenting for IOL 2/2 cholelithiasis and LGA.   Patient reports the fetal movement as active. Patient reports uterine contraction activity as irregular. Patient reports  vaginal bleeding as none. Patient describes fluid per vagina as None.   Patient denies headache, vision changes, chest pain, shortness of breath, right upper quadrant pain, or LE edema.  She plans on breast feeding and bottle feeding. Her contraception plan is: bilateral tubal ligation.  Prenatal History/Complications: PNC at [facility] - Cholelithiasis (symptomatic requiring oxycodone ) - asthma - LGA with hx of GDM  Sono:  @[redacted]w[redacted]d , CWD, normal anatomy, cephalic presentation, anterior placenta, 94%ile, AC >99%  Pregnancy complications:  Patient Active Problem List   Diagnosis Date Noted   Cholelithiasis 03/01/2024   Yeast infection 02/09/2024   Unwanted fertility 01/25/2024   History of gestational diabetes 10/06/2023   Obesity affecting pregnancy 10/06/2023   Supervision of high risk pregnancy, antepartum 07/29/2023   Enlarged ovary 01/29/2023   Maternal varicella, non-immune 01/23/2023   ASCUS with positive high risk HPV cervical 12/19/2022   Asthma 04/10/2020    Past Medical History: Past Medical History:  Diagnosis Date   Allergy    Asthma    Gall stones    Migraine     Past Surgical History: Past Surgical History:  Procedure Laterality Date   BARIATRIC SURGERY N/A 2022   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   WISDOM TOOTH EXTRACTION  2021   four;    Obstetrical History: OB History     Gravida  2   Para  1   Term  0   Preterm  1   AB  0   Living  1      SAB  0   IAB  0   Ectopic  0   Multiple  0   Live Births  1           Social History: Social History   Socioeconomic History   Marital status: Married    Spouse name: Luis    Number of children: 0   Years of education: 12   Highest education level: Not on file  Occupational History   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  Tobacco Use   Smoking status: Never   Smokeless tobacco: Never  Vaping Use   Vaping status: Never Used  Substance and Sexual Activity   Alcohol use: Never   Drug use: Never   Sexual activity: Yes    Partners: Male    Birth control/protection: None  Other Topics Concern   Not on file  Social History Narrative   Not on file   Social Drivers of Health   Financial Resource Strain: Low Risk  (04/03/2023)   Received from [facility]   Overall Financial Resource Strain (CARDIA)    Difficulty of Paying Living Expenses: Not hard at all  Food Insecurity: No Food Insecurity (11/27/2023)   Hunger Vital Sign    Worried About Running Out of Food in the Last Year: Never true    Ran Out of Food in the Last Year: Never true  Transportation Needs: No Transportation Needs (11/27/2023)   PRAPARE - Administrator, Civil Service (Medical): No    Lack of Transportation (Non-Medical): No  Physical Activity: Insufficiently Active (07/18/2022)   Exercise Vital Sign    Days of Exercise per Week: 5 days  Minutes of Exercise per Session: 20 min  Stress: Patient Declined (04/03/2023)   Received from [facility] - Occupational Stress Questionnaire    Feeling of Stress : Patient declined  Social Connections: Moderately Integrated (04/03/2023)   Received from [facility]   Social Network    How would you rate your social network (family, work, friends)?: Adequate participation with social networks    Family History: Family History  Problem Relation Age of Onset   Depression Mother    Anxiety disorder Mother    Healthy Mother    Bipolar disorder Mother    Bipolar disorder Father    Depression Father    Anxiety disorder Father    Healthy Father    Healthy Sister    Healthy Sister    Healthy  Brother    Healthy Brother    Healthy Brother    Eczema Brother    Cancer Maternal Grandmother        vaginal/uterine   Eczema Maternal Grandfather    Eczema Paternal Grandmother    Eczema Paternal Grandfather     Allergies: No Known Allergies  Medications Prior to Admission  Medication Sig Dispense Refill Last Dose/Taking   acetaminophen  (TYLENOL ) 325 MG tablet Take 2 tablets (650 mg total) by mouth every 4 (four) hours as needed (for pain scale < 4). 30 tablet 1    Blood Pressure Monitoring (BLOOD PRESSURE KIT) DEVI 1 Device by Does not apply route once a week. (Patient not taking: Reported on 02/24/2024) 1 each 0    budesonide  (PULMICORT ) 180 MCG/ACT inhaler Inhale 1 puff into the lungs 2 (two) times daily. 1 each 2    budesonide -formoterol  (SYMBICORT ) 80-4.5 MCG/ACT inhaler Inhale 2 puffs into the lungs as needed (for cough, shortness of breath, wheezing). 1 each 12    cetirizine  (ZYRTEC  ALLERGY) 10 MG tablet Take 1 tablet (10 mg total) by mouth daily. 30 tablet 11    fluticasone  (FLONASE ) 50 MCG/ACT nasal spray Place 2 sprays into both nostrils daily. 16 g 2    metroNIDAZOLE  (FLAGYL ) 500 MG tablet Take 1 tablet (500 mg total) by mouth 2 (two) times daily. (Patient not taking: Reported on 02/24/2024) 14 tablet 0    montelukast  (SINGULAIR ) 10 MG tablet Take 1 tablet (10 mg total) by mouth at bedtime. 30 tablet 3    oxyCODONE  (OXY IR/ROXICODONE ) 5 MG immediate release tablet Take 1 tablet (5 mg total) by mouth every 6 (six) hours as needed for up to 6 days for severe pain (pain score 7-10) or breakthrough pain. 12 tablet 0    Prenatal Vit-Fe Fumarate-FA (PRENATAL PLUS VITAMIN/MINERAL) 27-1 MG TABS Take 1 tablet by mouth daily. 30 tablet 12    terconazole  (TERAZOL 7 ) 0.4 % vaginal cream Place 1 applicator vaginally at bedtime. Use for seven days (Patient not taking: Reported on 02/24/2024) 45 g 0      Review of Systems  All systems reviewed and negative except as stated in  HPI  Physical Exam LMP 05/22/2023   Physical Exam  Presentation: cephalic by ***  Fetal monitoring: Baseline: *** bpm, Variability: {:31519}, Accelerations: {:31520}, and Decelerations: {FHR DECEL PRESENT:31526} Uterine activity: {Uterine contractions:31516}     Prenatal labs: ABO, Rh: O/Positive/-- (12/10 1438) Antibody: Negative (12/10 1438) Rubella: 1.69 (12/10 1438) RPR: Non Reactive (04/08 0913)  HBsAg: Negative (12/10 1438)  HIV: Non Reactive (04/08 0913)  GC/Chlamydia:  Neisseria Gonorrhea  Date Value Ref Range Status  02/13/2024 Negative  Final  Chlamydia  Date Value Ref Range Status  02/13/2024 Negative  Final   GBS: Negative/-- (05/12 1317)   Prenatal Transfer Tool  Maternal Diabetes: No Genetic Screening: Normal Maternal Ultrasounds/Referrals: Normal Fetal Ultrasounds or other Referrals:  None Maternal Substance Abuse:  No Significant Maternal Medications: symbicort , zyrtec , singulair , oxycodone  Significant Maternal Lab Results: Group B Strep negative  No results found for this or any previous visit (from the past 24 hours).  Assessment: Charlotte Cohen is a 23 y.o. G2P0101 at [redacted]w[redacted]d here for IOL 2/2 cholelithiasis, LGA.  #Labor: *** #Pain: IV pain meds PRN, epidural upon request #FHT: Category I #GBS/ID: Negative #MOF: breast feeding and bottle feeding #MOC: bilateral tubal ligation #Circ: Yes  #Cholelithiasis: Surgery consult PP #LGA: proven pelvis to similar EFW #Asthma: continue home regimen  Maud Sorenson, MD [facility] Fellow Center for [facility], [facility]  03/01/2024, 7:27 AM

## 2024-03-01 NOTE — Progress Notes (Signed)
 Patient ID: Charlotte Cohen, female   DOB: Nov 08, 2000, 23 y.o.   MRN: [id number] Progressing well  Vitals:   03/01/24 1930 03/01/24 2000 03/01/24 2030 03/01/24 2100  BP: 114/66 (!) 114/93 114/66 117/71  Pulse: 63 (!) 59 77 88  Temp: 98.5 F (36.9 C)     TempSrc: Oral     SpO2:      Weight:      Height:       FHR reassuring Average variability  Dilation: 8 Effacement (%): 90 Station: 0 Presentation: Vertex Exam by:: Champ Coma, RN  Anticipate SVD

## 2024-03-01 NOTE — H&P (Signed)
 OBSTETRIC ADMISSION HISTORY AND PHYSICAL  Charlotte Cohen is a 23 y.o. female G2P0101 with IUP at [redacted]w[redacted]d by 8wk US  presenting for IOL cholethiliasis. She reports +FMs, No LOF, no VB, no blurry vision, headaches or peripheral edema, and RUQ pain.  She plans on both breast and formula feeding. She request BTL for birth control, consents signed in chart on 12/08/23.   She received her prenatal care at [facility]   Dating: By 8 wk US  --->  Estimated Date of Delivery: 03/08/24  Sono:  @[redacted]w[redacted]d , CWD, normal anatomy, cephalic presentation, anterior placenta, 3394g, 94% EFW   Prenatal History/Complications:  -Asthma  -Symptomatic cholelithiasis  -History abnormal pap (ASCUS w/ HPV) -History of A1GDM in G1 -VNI (varicella non-immune) -Obesity        NURSING  PROVIDER  Office Location [facility] Dating by LMP c/w U/S at 9 wks  [facility] Model Traditional Anatomy U/S    Initiated care at  9wks                 Language  English               LAB RESULTS   Support Person  Luis FOB  Genetics NIPS:  AFP:       NT/IT (FT only)        Carrier Screen Horizon:   Rhogam  O/Positive/-- (12/10 1438) A1C/GTT Early:  Third trimester: normal  Flu Vaccine No      TDaP Vaccine  Given 12/22/2023 Blood Type O/Positive/-- (12/10 1438)  Covid Vaccine Yes Antibody Negative (12/10 1438)  RSV Vaccine   Rubella 1.69 (12/10 1438)  Feeding Plan both RPR Non Reactive (04/08 0913)  Contraception Tubal  HBsAg Negative (12/10 1438)  Circumcision Yes HIV Non Reactive (04/08 0913)  Pediatrician  [facility]/ Tita Form HCVAb Non Reactive (12/10 1438)  Prenatal Classes            Pap       Diagnosis  Date Value Ref Range Status  08/25/2023 (A)   Final    - Atypical squamous cells of undetermined significance (ASC-US )    BTL Consent 12/08/23 GC/CT Initial:   36wks:    VBAC Consent   GBS   For PCN allergy, check sensitivities            DME Rx [X]  BP cuff [ ]  Weight Scale Waterbirth  [ ]  Class [ ]  Consent [ ]  CNM visit   PHQ9 & GAD7 [X]  new OB [  ] 28 weeks  [ x ] 36 weeks Induction  [ ]  Orders Entered [ ] Foley Y/N     Past Medical History: Past Medical History:  Diagnosis Date   Allergy    Asthma    Last used 02/29/24   Cholestasis    Gall stones    Migraine     Past Surgical History: Past Surgical History:  Procedure Laterality Date   BARIATRIC SURGERY N/A 2022   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   WISDOM TOOTH EXTRACTION  2021   four;    Obstetrical History: OB History     Gravida  2   Para  1   Term  0   Preterm  1   AB  0   Living  1      SAB  0   IAB  0   Ectopic  0   Multiple  0   Live Births  1  Social History Social History   Socioeconomic History   Marital status: Married    Spouse name: Luis   Number of children: 0   Years of education: 12   Highest education level: Not on file  Occupational History   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  Tobacco Use   Smoking status: Never   Smokeless tobacco: Never  Vaping Use   Vaping status: Never Used  Substance and Sexual Activity   Alcohol use: Never   Drug use: Never   Sexual activity: Yes    Partners: Male    Birth control/protection: None  Other Topics Concern   Not on file  Social History Narrative   Not on file   Social Drivers of Health   Financial Resource Strain: Low Risk  (04/03/2023)   Received from [facility]   Overall Financial Resource Strain (CARDIA)    Difficulty of Paying Living Expenses: Not hard at all  Food Insecurity: No Food Insecurity (11/27/2023)   Hunger Vital Sign    Worried About Running Out of Food in the Last Year: Never true    Ran Out of Food in the Last Year: Never true  Transportation Needs: No Transportation Needs (11/27/2023)   PRAPARE - Administrator, Civil Service (Medical): No    Lack of Transportation (Non-Medical): No  Physical Activity: Insufficiently Active (07/18/2022)   Exercise Vital Sign    Days of  Exercise per Week: 5 days    Minutes of Exercise per Session: 20 min  Stress: Patient Declined (04/03/2023)   Received from [facility] - Occupational Stress Questionnaire    Feeling of Stress : Patient declined  Social Connections: Moderately Integrated (04/03/2023)   Received from [facility]   Social Network    How would you rate your social network (family, work, friends)?: Adequate participation with social networks    Family History: Family History  Problem Relation Age of Onset   Depression Mother    Anxiety disorder Mother    Healthy Mother    Bipolar disorder Mother    Bipolar disorder Father    Depression Father    Anxiety disorder Father    Healthy Father    Healthy Sister    Healthy Sister    Healthy Brother    Healthy Brother    Healthy Brother    Eczema Brother    Cancer Maternal Grandmother        vaginal/uterine   Eczema Maternal Grandfather    Eczema Paternal Grandmother    Eczema Paternal Grandfather     Allergies: No Known Allergies  Medications Prior to Admission  Medication Sig Dispense Refill Last Dose/Taking   cetirizine  (ZYRTEC  ALLERGY) 10 MG tablet Take 1 tablet (10 mg total) by mouth daily. 30 tablet 11 Past Week   fluticasone  (FLONASE ) 50 MCG/ACT nasal spray Place 2 sprays into both nostrils daily. 16 g 2 Past Week   montelukast  (SINGULAIR ) 10 MG tablet Take 1 tablet (10 mg total) by mouth at bedtime. 30 tablet 3 Past Week   oxyCODONE  (OXY IR/ROXICODONE ) 5 MG immediate release tablet Take 1 tablet (5 mg total) by mouth every 6 (six) hours as needed for up to 6 days for severe pain (pain score 7-10) or breakthrough pain. 12 tablet 0 Past Week   acetaminophen  (TYLENOL ) 325 MG tablet Take 2 tablets (650 mg total) by mouth every 4 (four) hours as needed (for pain scale < 4). 30 tablet 1  Blood Pressure Monitoring (BLOOD PRESSURE KIT) DEVI 1 Device by Does not apply route once a week. (Patient not  taking: Reported on 02/24/2024) 1 each 0    budesonide  (PULMICORT ) 180 MCG/ACT inhaler Inhale 1 puff into the lungs 2 (two) times daily. 1 each 2 Unknown   budesonide -formoterol  (SYMBICORT ) 80-4.5 MCG/ACT inhaler Inhale 2 puffs into the lungs as needed (for cough, shortness of breath, wheezing). 1 each 12 Unknown   metroNIDAZOLE  (FLAGYL ) 500 MG tablet Take 1 tablet (500 mg total) by mouth 2 (two) times daily. (Patient not taking: Reported on 02/24/2024) 14 tablet 0    Prenatal Vit-Fe Fumarate-FA (PRENATAL PLUS VITAMIN/MINERAL) 27-1 MG TABS Take 1 tablet by mouth daily. 30 tablet 12 Unknown   terconazole  (TERAZOL 7 ) 0.4 % vaginal cream Place 1 applicator vaginally at bedtime. Use for seven days (Patient not taking: Reported on 02/24/2024) 45 g 0      Review of Systems   All systems reviewed and negative except as stated in HPI  Blood pressure 127/82, pulse 80, temperature 98.1 F (36.7 C), temperature source Oral, height 5' 3 (1.6 m), weight 101.7 kg, last menstrual period 05/22/2023, unknown if currently breastfeeding. General appearance: alert, cooperative, appears stated age, and no distress Lungs: clear to auscultation bilaterally Heart: regular rate and rhythm Abdomen: soft, non-tender; bowel sounds normal Pelvic: 3/50/-2 per RN on exam  Extremities: Homans sign is negative, no sign of DVT Presentation: cephalic Fetal monitoringBaseline: 130 bpm, Variability: Good {> 6 bpm), Accelerations: Reactive, and Decelerations: Absent Uterine activity: Sporadic, inconsistent toco pattern  Dilation: (P) 3 Effacement (%): (P) 50 Station: (P) -2 Exam by:: (P) J.Cox, RN   Prenatal labs: ABO, Rh: O/Positive/-- (12/10 1438) Antibody: Negative (12/10 1438) Rubella: 1.69 (12/10 1438) RPR: Non Reactive (04/08 0913)  HBsAg: Negative (12/10 1438)  HIV: Non Reactive (04/08 0913)  GBS: Negative/-- (05/12 1317)    Lab Results  Component Value Date   GBS Negative 01/25/2024    Immunization  History  Administered Date(s) Administered   PFIZER(Purple Top)SARS-COV-2 Vaccination 02/06/2020   Pfizer Covid-19 Vaccine Bivalent Booster 94yrs & up 02/28/2020, 03/29/2020   Tdap 12/22/2023    Prenatal Transfer Tool  Maternal Diabetes: No Genetic Screening: Normal Maternal Ultrasounds/Referrals: Normal Fetal Ultrasounds or other Referrals:  None Maternal Substance Abuse:  No Significant Maternal Medications:  Meds include: Other:  ICS/LABA inhaler, albuterol , Zyrtec , Singulair   Significant Maternal Lab Results: Other:  Number of Prenatal Visits:greater than 3 verified prenatal visits Maternal Vaccinations:TDap and Covid, influenza not documented Other Comments:  None   Results for orders placed or performed during the hospital encounter of 03/01/24 (from the past 24 hours)  CBC   Collection Time: 03/01/24  8:13 AM  Result Value Ref Range   WBC 7.9 4.0 - 10.5 K/uL   RBC 3.89 3.87 - 5.11 MIL/uL   Hemoglobin 11.5 (L) 12.0 - 15.0 g/dL   HCT 54.0 (L) 98.1 - 19.1 %   MCV 91.3 80.0 - 100.0 fL   MCH 29.6 26.0 - 34.0 pg   MCHC 32.4 30.0 - 36.0 g/dL   RDW 47.8 29.5 - 62.1 %   Platelets 167 150 - 400 K/uL   nRBC 0.0 0.0 - 0.2 %    Patient Active Problem List   Diagnosis Date Noted   Cholelithiasis 03/01/2024   History of sleeve gastrectomy 03/01/2024   Yeast infection 02/09/2024   Unwanted fertility 01/25/2024   History of gestational diabetes 10/06/2023   Obesity affecting pregnancy 10/06/2023   Supervision of  high risk pregnancy, antepartum 07/29/2023   Enlarged ovary 01/29/2023   Maternal varicella, non-immune 01/23/2023   ASCUS with positive high risk HPV cervical 12/19/2022   Asthma 04/10/2020    Assessment/Plan:  Gentry Mattei Gonzalez is a 23 y.o. G2P0101 at [redacted]w[redacted]d here for IOL cholelithiasis.   #Labor: SVE 3/50/-2 on admission. Will begin augmentation with pitocin  per protocol. Patient amenable to AROM once eligible.  #Pain: Well controlled on admission.  Desires epidural in the future. #FWB: Category 1 #GBS status:  negative #Feeding: Breastmilk  and Formula #Reproductive Life planning: Tubal Ligation #Circ:  yes  #LGA  -At 36 wks EFW 3394 g, 94%ile, now at 39 wks EFW ~4100 g for 94%ile  -Proven pelvis w/ EFW 3480 g in G1  #Cholelithiasis  -Symptomatic during this pregnancy  -Referral has been placed to general surgery for postpartum cholecystectomy  #Asthma  -Currently well controlled, no acute exacerbation on admission -Managed with controller ICS/LABA inhaler and albuterol  prn   #Obesity  -BMI 39  #Desires permanent sterility  -BTL consents signed 12/08/23 -PP BTL dependent on pp abdominal exam   Ephraim Hash, MD  03/01/2024, 9:17 AM

## 2024-03-01 NOTE — Progress Notes (Addendum)
 Labor Progress Note Charlotte Cohen is a 23 y.o. G2P0101 at [redacted]w[redacted]d presented for IOL cholelithiasis  S: Patient resting comfortably in bed. Spouse at bedside. Pain is tolerable  O:  BP 110/61   Pulse 67   Temp 98.1 F (36.7 C) (Oral)   Ht 5' 3 (1.6 m)   Wt 101.7 kg   LMP 05/22/2023   BMI 39.72 kg/m  EFM: baseline 140 bpm/ moderate variability/ + accels/ no decels  Toco/IUPC: ctx q2-6 min SVE: Dilation: 3 Effacement (%): 50 Station: -2 Presentation: Vertex Exam by:: J.Cox, RN Pitocin : 12 mu/min  A/P: 23 y.o. G2P0101 [redacted]w[redacted]d  1. Labor: SVE 3/50/-2 at 0846. Pit at 12. Consider AROM at next check  2. FWB: Cat 1 3. Pain: tolerable   Ephraim Hash, MD 12:56 PM

## 2024-03-01 NOTE — Discharge Summary (Signed)
 Postpartum Discharge Summary  Date of Service updated***     Patient Name: Charlotte Cohen DOB: [birth year] MRN: [id number]  Date of admission: 03/01/2024 Delivery date:03/01/2024 Delivering provider: Harlee Lichtenstein Date of discharge: 03/01/2024  Admitting diagnosis: Cholelithiasis [K80.20] Intrauterine pregnancy: [redacted]w[redacted]d     Secondary diagnosis:  Principal Problem:   Cholelithiasis Active Problems:   Asthma   ASCUS with positive high risk HPV cervical   Supervision of high risk pregnancy, antepartum   Maternal varicella, non-immune   Obesity affecting pregnancy   Unwanted fertility   History of sleeve gastrectomy   Vaginal delivery  Additional problems: Shoulder Dystocia    Discharge diagnosis: Term Pregnancy Delivered and LGA, Shoulder Dystocia                                              Post partum procedures:postpartum tubal ligation Augmentation: AROM and Pitocin  Complications: None  Hospital course: Induction of Labor With Vaginal Delivery   23 y.o. yo G2P0101 at [redacted]w[redacted]d was admitted to the hospital 03/01/2024 for induction of labor.  Indication for induction: Cholelithiasis, LGA.  Patient had an labor course complicated byNothing except shoulder dystocia at delivery x ,  Cord pH 7.38 Membrane Rupture Time/Date: 5:54 PM,03/01/2024  Delivery Method:Vaginal, Spontaneous Operative Delivery:N/A Episiotomy: None Lacerations:  None Details of delivery can be found in separate delivery note.  Patient had a postpartum course complicated by***. Patient is discharged home 03/01/24.  Newborn Data: Birth date:03/01/2024 Birth time:10:04 PM Gender:Female Living status:Living Apgars:7 ,9  Weight:   Magnesium  Sulfate received: No BMZ received: No Rhophylac:N/A MMR:No T-DaP:{Tdap:23962} Flu: {WUJ:81191} RSV Vaccine received: {RSV:31013} Transfusion:{Transfusion received:30440034} Immunizations administered: Immunization History  Administered Date(s)  Administered   PFIZER(Purple Top)SARS-COV-2 Vaccination 02/06/2020   Pfizer Covid-19 Vaccine Bivalent Booster 45yrs & up 02/28/2020, 03/29/2020   Tdap 12/22/2023    Physical exam  Vitals:   03/01/24 2200 03/01/24 2215 03/01/24 2230 03/01/24 2245  BP: (!) 145/112 (!) 121/38 120/66 124/77  Pulse:  85 67 88  Temp:      TempSrc:      SpO2:      Weight:      Height:       General: {Exam; general:21111117} Lochia: {Desc; appropriate/inappropriate:30686::appropriate} Uterine Fundus: {Desc; firm/soft:30687} Incision: {Exam; incision:21111123} DVT Evaluation: {Exam; dvt:2111122} Labs: Lab Results  Component Value Date   WBC 7.9 03/01/2024   HGB 11.5 (L) 03/01/2024   HCT 35.5 (L) 03/01/2024   MCV 91.3 03/01/2024   PLT 167 03/01/2024      Latest Ref Rng & Units 02/17/2024    8:09 AM  CMP  Glucose 70 - 99 mg/dL 90   BUN 6 - 20 mg/dL 6   Creatinine 4.78 - 2.95 mg/dL 6.21   Sodium 308 - 657 mmol/L 134   Potassium 3.5 - 5.1 mmol/L 3.6   Chloride 98 - 111 mmol/L 105   CO2 22 - 32 mmol/L 21   Calcium 8.9 - 10.3 mg/dL 8.8   Total Protein 6.5 - 8.1 g/dL 5.9   Total Bilirubin 0.0 - 1.2 mg/dL 0.9   Alkaline Phos 38 - 126 U/L 96   AST 15 - 41 U/L 46   ALT 0 - 44 U/L 26    Edinburgh Score:    02/11/2023    3:20 PM  Edinburgh Postnatal Depression Scale Screening Tool  I have been able  to laugh and see the funny side of things. 0  I have looked forward with enjoyment to things. 0  I have blamed myself unnecessarily when things went wrong. 3  I have been anxious or worried for no good reason. 0  I have felt scared or panicky for no good reason. 0  Things have been getting on top of me. 0  I have been so unhappy that I have had difficulty sleeping. 0  I have felt sad or miserable. 1  I have been so unhappy that I have been crying. 1  The thought of harming myself has occurred to me. 0  Edinburgh Postnatal Depression Scale Total 5      Data saved with a previous flowsheet row  definition      After visit meds:  Allergies as of 03/01/2024   No Known Allergies   Med Rec must be completed prior to using this [facility]***        Discharge home in stable condition Infant Feeding: Breast Infant Disposition:{CHL IP OB HOME WITH VHQION:62952} Discharge instruction: per After Visit Summary and Postpartum booklet. Activity: Advance as tolerated. Pelvic rest for 6 weeks.  Diet: routine diet Anticipated Birth Control: BTL done PP Postpartum Appointment:{Outpatient follow up:23559} Additional Postpartum F/U: {PP Procedure:23957} Future Appointments: Future Appointments  Date Time Provider Department Center  03/02/2024  8:35 AM Zelma Hidden, FNP [facility] None   Follow up Visit:      03/01/2024 Holmes Lusher, CNM  Please schedule this patient for Postpartum visit in: 1 week with the following provider: Any provider In-Person For C/S patients schedule nurse incision check in 1-2 weeks: no Low risk pregnancy complicated by: cholelithiasis Delivery mode:  SVD Anticipated Birth Control:  BTL done PP PP Procedures needed: Incision check  Edinburgh: negative Schedule Integrated BH visit: no  no relevant baby issues

## 2024-03-01 NOTE — Progress Notes (Addendum)
 Labor Progress Note Charlotte Cohen is a 23 y.o. G2P0101 at [redacted]w[redacted]d presented for IOL cholelithiasis  S: Resting in bed comfortably since epidural. Ready for AROM  O:  BP 120/63   Pulse 72   Temp 98.1 F (36.7 C) (Oral)   Ht 5' 3 (1.6 m)   Wt 101.7 kg   LMP 05/22/2023   SpO2 99%   BMI 39.72 kg/m  EFM: baseline 140 bpm/ moderate variability/ + accels/ no decels  Toco: ctx q2-4 min SVE: Dilation: 5 Effacement (%): 70 Station: -1 Presentation: Vertex Exam by:: Felipe Horton, MD Pitocin : 20 mu/min  A/P: 23 y.o. G2P0101 [redacted]w[redacted]d  1. Labor: SVE 5/70/-1. AROM @ 1754, clear fluids. Continue pit.  2. FWB: Cat 1 3. Pain: tolerable, epidural in place   Anticipate SVD  Ephraim Hash, MD 5:57 PM

## 2024-03-01 NOTE — Anesthesia Procedure Notes (Signed)
 Epidural Patient location during procedure: OB Start time: 03/01/2024 4:29 PM End time: 03/01/2024 4:39 PM  Staffing Anesthesiologist: Leslye Rast, MD Performed: anesthesiologist   Preanesthetic Checklist Completed: patient identified, IV checked, site marked, risks and benefits discussed, surgical consent, monitors and equipment checked, pre-op evaluation and timeout performed  Epidural Patient position: sitting Prep: DuraPrep Patient monitoring: heart rate, continuous pulse ox and blood pressure Approach: midline Location: L4-L5 Injection technique: LOR saline  Needle:  Needle type: Tuohy  Needle gauge: 17 G Needle length: 9 cm Needle insertion depth: 7 cm Catheter type: closed end flexible Catheter size: 19 Gauge Catheter at skin depth: 12 cm Test dose: negative and 1.5% lidocaine  with Epi 1:200 K  Assessment Events: blood not aspirated, no cerebrospinal fluid, injection not painful, no injection resistance, no paresthesia and negative IV test  Additional Notes Reason for block:procedure for pain

## 2024-03-01 NOTE — Anesthesia Preprocedure Evaluation (Signed)
 Anesthesia Evaluation  Patient identified by MRN, date of birth, ID band Patient awake    Reviewed: Allergy & Precautions, NPO status , Patient's Chart, lab work & pertinent test results, reviewed documented beta blocker date and time   History of Anesthesia Complications Negative for: history of anesthetic complications  Airway Mallampati: III       Dental no notable dental hx.    Pulmonary asthma , neg COPD   breath sounds clear to auscultation       Cardiovascular (-) angina (-) CAD and (-) Past MI  Rhythm:Regular Rate:Normal     Neuro/Psych  Headaches, neg Seizures    GI/Hepatic ,,,(+) neg Cirrhosis        Endo/Other    Renal/GU Renal disease     Musculoskeletal   Abdominal   Peds  Hematology   Anesthesia Other Findings   Reproductive/Obstetrics (+) Pregnancy                             Anesthesia Physical Anesthesia Plan  ASA: 2  Anesthesia Plan: Epidural   Post-op Pain Management:    Induction:   PONV Risk Score and Plan:   Airway Management Planned:   Additional Equipment:   Intra-op Plan:   Post-operative Plan:   Informed Consent: I have reviewed the patients History and Physical, chart, labs and discussed the procedure including the risks, benefits and alternatives for the proposed anesthesia with the patient or authorized representative who has indicated his/her understanding and acceptance.       Plan Discussed with:   Anesthesia Plan Comments:        Anesthesia Quick Evaluation

## 2024-03-02 ENCOUNTER — Encounter: Admitting: Obstetrics and Gynecology

## 2024-03-02 LAB — CBC
HCT: 33.2 % — ABNORMAL LOW (ref 36.0–46.0)
Hemoglobin: 10.9 g/dL — ABNORMAL LOW (ref 12.0–15.0)
MCH: 30.8 pg (ref 26.0–34.0)
MCHC: 32.8 g/dL (ref 30.0–36.0)
MCV: 93.8 fL (ref 80.0–100.0)
Platelets: 147 10*3/uL — ABNORMAL LOW (ref 150–400)
RBC: 3.54 MIL/uL — ABNORMAL LOW (ref 3.87–5.11)
RDW: 13.8 % (ref 11.5–15.5)
WBC: 9.7 10*3/uL (ref 4.0–10.5)
nRBC: 0 % (ref 0.0–0.2)

## 2024-03-02 LAB — BIRTH TISSUE RECOVERY COLLECTION (PLACENTA DONATION)

## 2024-03-02 MED ORDER — DIBUCAINE (PERIANAL) 1 % EX OINT: 1.0000 | TOPICAL_OINTMENT | CUTANEOUS | Status: DC | PRN

## 2024-03-02 MED ORDER — BENZOCAINE-MENTHOL 20-0.5 % EX AERO
1.0000 | INHALATION_SPRAY | CUTANEOUS | Status: DC | PRN
  Filled 2024-03-02: qty 56

## 2024-03-02 MED ORDER — ACETAMINOPHEN 325 MG PO TABS
650.0000 mg | ORAL_TABLET | ORAL | Status: DC | PRN
  Administered 2024-03-02 (×3): 650 mg via ORAL
  Filled 2024-03-02 (×3): qty 2

## 2024-03-02 MED ORDER — TETANUS-DIPHTH-ACELL PERTUSSIS 5-2.5-18.5 LF-MCG/0.5 IM SUSY: 0.5000 mL | PREFILLED_SYRINGE | Freq: Once | INTRAMUSCULAR | Status: DC

## 2024-03-02 MED ORDER — LACTATED RINGERS IV SOLN: INTRAVENOUS | Status: DC

## 2024-03-02 MED ORDER — ZOLPIDEM TARTRATE 5 MG PO TABS: 5.0000 mg | ORAL_TABLET | Freq: Every evening | ORAL | Status: DC | PRN

## 2024-03-02 MED ORDER — SENNOSIDES-DOCUSATE SODIUM 8.6-50 MG PO TABS
2.0000 | ORAL_TABLET | ORAL | Status: DC
  Administered 2024-03-02 – 2024-03-03 (×2): 2 via ORAL
  Filled 2024-03-02 (×2): qty 2

## 2024-03-02 MED ORDER — PRENATAL MULTIVITAMIN CH
1.0000 | ORAL_TABLET | Freq: Every day | ORAL | Status: DC
  Administered 2024-03-02: 1 via ORAL
  Filled 2024-03-02: qty 1

## 2024-03-02 MED ORDER — METOCLOPRAMIDE HCL 10 MG PO TABS
10.0000 mg | ORAL_TABLET | Freq: Once | ORAL | Status: AC
  Administered 2024-03-03: 10 mg via ORAL
  Filled 2024-03-02: qty 1

## 2024-03-02 MED ORDER — METOCLOPRAMIDE HCL 10 MG PO TABS: 10.0000 mg | ORAL_TABLET | Freq: Once | ORAL | Status: DC

## 2024-03-02 MED ORDER — DIPHENHYDRAMINE HCL 25 MG PO CAPS: 25.0000 mg | ORAL_CAPSULE | Freq: Four times a day (QID) | ORAL | Status: DC | PRN

## 2024-03-02 MED ORDER — MONTELUKAST SODIUM 10 MG PO TABS
10.0000 mg | ORAL_TABLET | Freq: Every day | ORAL | Status: DC
  Administered 2024-03-02: 10 mg via ORAL
  Filled 2024-03-02 (×2): qty 1

## 2024-03-02 MED ORDER — SIMETHICONE 80 MG PO CHEW: 80.0000 mg | CHEWABLE_TABLET | ORAL | Status: DC | PRN

## 2024-03-02 MED ORDER — FLUTICASONE FUROATE-VILANTEROL 100-25 MCG/ACT IN AEPB
1.0000 | INHALATION_SPRAY | Freq: Every day | RESPIRATORY_TRACT | Status: DC
  Administered 2024-03-02 – 2024-03-03 (×2): 1 via RESPIRATORY_TRACT
  Filled 2024-03-02: qty 28

## 2024-03-02 MED ORDER — FAMOTIDINE 20 MG PO TABS: 40.0000 mg | ORAL_TABLET | Freq: Once | ORAL | Status: DC

## 2024-03-02 MED ORDER — OXYCODONE HCL 5 MG PO TABS
5.0000 mg | ORAL_TABLET | Freq: Four times a day (QID) | ORAL | Status: DC | PRN
  Administered 2024-03-02 – 2024-03-03 (×5): 10 mg via ORAL
  Filled 2024-03-02 (×6): qty 2

## 2024-03-02 MED ORDER — LORATADINE 10 MG PO TABS
10.0000 mg | ORAL_TABLET | Freq: Every day | ORAL | Status: DC
  Administered 2024-03-02 – 2024-03-03 (×2): 10 mg via ORAL
  Filled 2024-03-02 (×2): qty 1

## 2024-03-02 MED ORDER — COCONUT OIL OIL: 1.0000 | TOPICAL_OIL | Status: DC | PRN

## 2024-03-02 MED ORDER — IBUPROFEN 600 MG PO TABS
600.0000 mg | ORAL_TABLET | Freq: Four times a day (QID) | ORAL | Status: DC
  Administered 2024-03-02 – 2024-03-03 (×5): 600 mg via ORAL
  Filled 2024-03-02 (×6): qty 1

## 2024-03-02 MED ORDER — ONDANSETRON HCL 4 MG PO TABS: 4.0000 mg | ORAL_TABLET | ORAL | Status: DC | PRN

## 2024-03-02 MED ORDER — FAMOTIDINE 20 MG PO TABS
40.0000 mg | ORAL_TABLET | Freq: Once | ORAL | Status: AC
  Administered 2024-03-03: 40 mg via ORAL
  Filled 2024-03-02: qty 2

## 2024-03-02 MED ORDER — WITCH HAZEL-GLYCERIN EX PADS: 1.0000 | MEDICATED_PAD | CUTANEOUS | Status: DC | PRN

## 2024-03-02 MED ORDER — ONDANSETRON HCL 4 MG/2ML IJ SOLN: 4.0000 mg | INTRAMUSCULAR | Status: DC | PRN

## 2024-03-02 NOTE — Progress Notes (Signed)
 Post Partum Day 1 Subjective: no complaints, up ad lib, voiding, and tolerating PO  Objective: Blood pressure (!) 98/54, pulse 66, temperature 98 F (36.7 C), temperature source Oral, resp. rate 18, height 5' 3 (1.6 m), weight 101.7 kg, last menstrual period 05/22/2023, SpO2 95%, unknown if currently breastfeeding.  Physical Exam:  General: alert and no distress Lochia: appropriate Uterine Fundus: firm Incision: none DVT Evaluation: No evidence of DVT seen on physical exam.  Recent Labs    03/01/24 0813 03/02/24 0524  HGB 11.5* 10.9*  HCT 35.5* 33.2*    Assessment/Plan: Plan for discharge tomorrow and Breastfeeding   LOS: 1 day   Holmes Lusher, CNM 03/02/2024, 7:07 AM

## 2024-03-02 NOTE — Lactation Note (Signed)
 This note was copied from a baby's chart. Lactation Consultation Note  Patient Name: Charlotte Cohen [id number] Date: 03/02/2024 Age:24 hours, P2  Reason for consult: Initial assessment;Term;Infant weight loss (1 % weight loss, LC encouraged mom to call with feeding cues prior to supplementing for latch assessment. LC reviewed supply and demand.)   Maternal Data Does the patient have breastfeeding experience prior to this delivery?: Yes How long did the patient breastfeed?: per mom few months and included pumping  Feeding Mother's Current Feeding Choice: Breast Milk and Formula Nipple Type: Slow - flow  LATCH Score - none as of yet    Lactation Tools Discussed/Used  None as of yet   Interventions Interventions: Breast feeding basics reviewed;Education;LC Services brochure;CDC Guidelines for Breast Pump Cleaning;CDC milk storage guidelines  Discharge Pump: DEBP;Personal (per mom Medela)  Consult Status Consult Status: Follow-up Date: 03/02/24 Follow-up type: In-patient    [name] [name] 03/02/2024, 3:02 PM

## 2024-03-02 NOTE — Anesthesia Postprocedure Evaluation (Signed)
 Anesthesia Post Note  Patient: Charlotte Cohen  Procedure(s) Performed: AN AD HOC LABOR EPIDURAL     Patient location during evaluation: Mother Baby Anesthesia Type: Epidural Level of consciousness: awake and alert Pain management: pain level controlled Vital Signs Assessment: post-procedure vital signs reviewed and stable Respiratory status: spontaneous breathing, nonlabored ventilation and respiratory function stable Cardiovascular status: stable Postop Assessment: no headache, no backache and epidural receding Anesthetic complications: no   No notable events documented.  Last Vitals:  Vitals:   03/02/24 0121 03/02/24 0506  BP: 112/70 (!) 98/54  Pulse: 77 66  Resp: 18 18  Temp: 36.7 C 36.7 C  SpO2: 98% 95%    Last Pain:  Vitals:   03/02/24 0507  TempSrc:   PainSc: 10-Worst pain ever   Pain Goal:                   Matalie Romberger

## 2024-03-03 ENCOUNTER — Encounter (HOSPITAL_COMMUNITY)
Admission: RE | Disposition: A | Discharge status: Home / Self Care | Payer: Self-pay | Source: Home / Self Care | Attending: Obstetrics and Gynecology

## 2024-03-03 ENCOUNTER — Inpatient Hospital Stay (HOSPITAL_COMMUNITY)

## 2024-03-03 ENCOUNTER — Encounter (HOSPITAL_COMMUNITY): Payer: Self-pay | Admitting: Obstetrics & Gynecology

## 2024-03-03 ENCOUNTER — Other Ambulatory Visit: Payer: Self-pay

## 2024-03-03 DIAGNOSIS — Z302 Encounter for sterilization: Secondary | ICD-10-CM

## 2024-03-03 HISTORY — PX: TUBAL LIGATION: SHX77

## 2024-03-03 SURGERY — LIGATION, FALLOPIAN TUBE, POSTPARTUM
Anesthesia: Choice | Laterality: Bilateral

## 2024-03-03 MED ORDER — PHENYLEPHRINE HCL (PRESSORS) 10 MG/ML IV SOLN
INTRAVENOUS | Status: DC | PRN
Start: 2024-03-03 — End: 2024-03-03
  Administered 2024-03-03: 80 ug via INTRAVENOUS
  Administered 2024-03-03: 160 ug via INTRAVENOUS

## 2024-03-03 MED ORDER — STERILE WATER FOR IRRIGATION IR SOLN
Status: DC | PRN
  Administered 2024-03-03: 1000 mL

## 2024-03-03 MED ORDER — DEXAMETHASONE SODIUM PHOSPHATE 10 MG/ML IJ SOLN
INTRAMUSCULAR | Status: AC
  Filled 2024-03-03: qty 1

## 2024-03-03 MED ORDER — PROPOFOL 10 MG/ML IV BOLUS
INTRAVENOUS | Status: AC
  Filled 2024-03-03: qty 20

## 2024-03-03 MED ORDER — IBUPROFEN 600 MG PO TABS: 600.0000 mg | ORAL_TABLET | Freq: Four times a day (QID) | ORAL | 1 refills | Status: AC

## 2024-03-03 MED ORDER — SODIUM CHLORIDE 0.9 % IR SOLN
Status: DC | PRN
  Administered 2024-03-03: 1000 mL

## 2024-03-03 MED ORDER — ACETAMINOPHEN 10 MG/ML IV SOLN
INTRAVENOUS | Status: AC
  Filled 2024-03-03: qty 100

## 2024-03-03 MED ORDER — ONDANSETRON HCL 4 MG/2ML IJ SOLN
INTRAMUSCULAR | Status: DC | PRN
  Administered 2024-03-03: 4 mg via INTRAVENOUS

## 2024-03-03 MED ORDER — KETOROLAC TROMETHAMINE 30 MG/ML IJ SOLN: 30.0000 mg | Freq: Once | INTRAMUSCULAR | Status: DC

## 2024-03-03 MED ORDER — KETOROLAC TROMETHAMINE 30 MG/ML IJ SOLN
INTRAMUSCULAR | Status: DC | PRN
  Administered 2024-03-03: 30 mg via INTRAVENOUS

## 2024-03-03 MED ORDER — LIDOCAINE 2% (20 MG/ML) 5 ML SYRINGE
INTRAMUSCULAR | Status: AC
  Filled 2024-03-03: qty 5

## 2024-03-03 MED ORDER — WITCH HAZEL-GLYCERIN EX PADS: 1.0000 | MEDICATED_PAD | CUTANEOUS | 12 refills | Status: DC | PRN

## 2024-03-03 MED ORDER — FENTANYL CITRATE (PF) 100 MCG/2ML IJ SOLN
INTRAMUSCULAR | Status: AC
Start: 2024-03-03 — End: 2024-03-03
  Filled 2024-03-03: qty 2

## 2024-03-03 MED ORDER — FENTANYL CITRATE (PF) 100 MCG/2ML IJ SOLN
25.0000 ug | INTRAMUSCULAR | Status: DC | PRN
  Administered 2024-03-03: 25 ug via INTRAVENOUS
  Administered 2024-03-03 (×2): 50 ug via INTRAVENOUS

## 2024-03-03 MED ORDER — KETOROLAC TROMETHAMINE 30 MG/ML IJ SOLN
INTRAMUSCULAR | Status: AC
  Filled 2024-03-03: qty 1

## 2024-03-03 MED ORDER — ONDANSETRON HCL 4 MG/2ML IJ SOLN
INTRAMUSCULAR | Status: AC
Start: 2024-03-03 — End: 2024-03-03
  Filled 2024-03-03: qty 2

## 2024-03-03 MED ORDER — DROPERIDOL 2.5 MG/ML IJ SOLN: 0.6250 mg | Freq: Once | INTRAMUSCULAR | Status: DC | PRN

## 2024-03-03 MED ORDER — ROCURONIUM BROMIDE 100 MG/10ML IV SOLN
INTRAVENOUS | Status: DC | PRN
  Administered 2024-03-03: 50 mg via INTRAVENOUS

## 2024-03-03 MED ORDER — SUGAMMADEX SODIUM 200 MG/2ML IV SOLN
INTRAVENOUS | Status: DC | PRN
  Administered 2024-03-03: 200 mg via INTRAVENOUS

## 2024-03-03 MED ORDER — BENZOCAINE-MENTHOL 20-0.5 % EX AERO: 1.0000 | INHALATION_SPRAY | CUTANEOUS | Status: DC | PRN

## 2024-03-03 MED ORDER — DEXAMETHASONE SODIUM PHOSPHATE 10 MG/ML IJ SOLN
INTRAMUSCULAR | Status: DC | PRN
  Administered 2024-03-03: 10 mg via INTRAVENOUS

## 2024-03-03 MED ORDER — LIDOCAINE 2% (20 MG/ML) 5 ML SYRINGE
INTRAMUSCULAR | Status: DC | PRN
  Administered 2024-03-03: 100 mg via INTRAVENOUS

## 2024-03-03 MED ORDER — PROPOFOL 10 MG/ML IV BOLUS
INTRAVENOUS | Status: DC | PRN
Start: 2024-03-03 — End: 2024-03-03
  Administered 2024-03-03: 200 mg via INTRAVENOUS

## 2024-03-03 MED ORDER — LIDOCAINE-EPINEPHRINE (PF) 2 %-1:200000 IJ SOLN
INTRAMUSCULAR | Status: AC
  Filled 2024-03-03: qty 20

## 2024-03-03 MED ORDER — FENTANYL CITRATE (PF) 100 MCG/2ML IJ SOLN
INTRAMUSCULAR | Status: DC | PRN
  Administered 2024-03-03: 100 ug via INTRAVENOUS

## 2024-03-03 MED ORDER — LACTATED RINGERS IV SOLN: INTRAVENOUS | Status: DC | PRN

## 2024-03-03 MED ORDER — COCONUT OIL OIL: 1.0000 | TOPICAL_OIL | Status: DC | PRN

## 2024-03-03 MED ORDER — ACETAMINOPHEN 10 MG/ML IV SOLN
INTRAVENOUS | Status: DC | PRN
  Administered 2024-03-03: 1000 mg via INTRAVENOUS

## 2024-03-03 MED ORDER — DIBUCAINE (PERIANAL) 1 % EX OINT
1.0000 | TOPICAL_OINTMENT | CUTANEOUS | Status: DC | PRN
Start: 2024-03-03 — End: 2024-03-25

## 2024-03-03 MED ORDER — ROCURONIUM BROMIDE 10 MG/ML (PF) SYRINGE
PREFILLED_SYRINGE | INTRAVENOUS | Status: AC
  Filled 2024-03-03: qty 10

## 2024-03-03 SURGICAL SUPPLY — 21 items
DISSECTOR SURG LIGASURE 21 (MISCELLANEOUS) IMPLANT
DRSG OPSITE POSTOP 3X4 (GAUZE/BANDAGES/DRESSINGS) ×1 IMPLANT
DURAPREP 26ML APPLICATOR (WOUND CARE) ×1 IMPLANT
GLOVE BIOGEL PI IND STRL 7.0 (GLOVE) ×2 IMPLANT
GLOVE SURG SS PI 7.0 STRL IVOR (GLOVE) ×1 IMPLANT
GOWN STRL REUS W/TWL LRG LVL3 (GOWN DISPOSABLE) ×1 IMPLANT
NEEDLE HYPO 22GX1.5 SAFETY (NEEDLE) ×1 IMPLANT
NS IRRIG 1000ML POUR BTL (IV SOLUTION) ×1 IMPLANT
PACK ABDOMINAL MINOR (CUSTOM PROCEDURE TRAY) ×1 IMPLANT
PROTECTOR NERVE ULNAR (MISCELLANEOUS) ×1 IMPLANT
RETRACTOR WOUND ALXS 19CM XSML (INSTRUMENTS) IMPLANT
SPONGE GAUZE 2X2 8PLY STRL LF (GAUZE/BANDAGES/DRESSINGS) ×1 IMPLANT
SPONGE LAP 4X18 RFD (DISPOSABLE) ×1 IMPLANT
SUT MON AB 2-0 CT1 36 (SUTURE) IMPLANT
SUT PLAIN 0 NONE (SUTURE) IMPLANT
SUT PLAIN ABS 2-0 CT1 27XMFL (SUTURE) IMPLANT
SUT VIC AB 3-0 PS2 18 (SUTURE) ×1 IMPLANT
SUT VICRYL 0 UR6 27IN ABS (SUTURE) ×1 IMPLANT
SYR CONTROL 10ML LL (SYRINGE) ×1 IMPLANT
TOWEL OR 17X24 6PK STRL BLUE (TOWEL DISPOSABLE) ×1 IMPLANT
WATER STERILE IRR 1000ML POUR (IV SOLUTION) ×1 IMPLANT

## 2024-03-03 NOTE — Anesthesia Procedure Notes (Signed)
 Procedure Name: Intubation Date/Time: 03/03/2024 10:23 AM  Performed by: Elidia Grout, CRNAPre-anesthesia Checklist: Patient identified, Patient being monitored, Timeout performed, Emergency Drugs available and Suction available Patient Re-evaluated:Patient Re-evaluated prior to induction Oxygen Delivery Method: Circle System Utilized Preoxygenation: Pre-oxygenation with 100% oxygen Induction Type: IV induction Ventilation: Mask ventilation without difficulty Laryngoscope Size: Mac and 3 Grade View: Grade II Tube type: Oral Tube size: 6.5 mm Number of attempts: 1 Airway Equipment and Method: stylet Placement Confirmation: ETT inserted through vocal cords under direct vision, positive ETCO2 and breath sounds checked- equal and bilateral Secured at: 21 cm Tube secured with: Tape Dental Injury: Teeth and Oropharynx as per pre-operative assessment  Comments: Intubation by Juli Oas

## 2024-03-03 NOTE — Progress Notes (Signed)
 As patient was being discharged she asked nurse to have provider take a look at honey comb dressing as she felt blood had accumulated under the dressing.  Went to bedside and about 20cc of old blood seen under honeycomb dressing.  Old dressing removed and cleaned.  No active bleeding noted however steri strips applied to help ensure continued approximation and pressure at the incision site.  Again inspected without bleeding and new honey comb dressing applies.  No bleeding noted prior to leaving the room.  Pt grateful.   Candice Chalet, MD Family Medicine - Obstetrics Fellow

## 2024-03-03 NOTE — Anesthesia Preprocedure Evaluation (Addendum)
 Anesthesia Evaluation  Patient identified by MRN, date of birth, ID band Patient awake    Reviewed: Allergy & Precautions, NPO status , Patient's Chart, lab work & pertinent test results  History of Anesthesia Complications Negative for: history of anesthetic complications  Airway Mallampati: II  TM Distance: >3 FB Neck ROM: Full    Dental no notable dental hx.    Pulmonary asthma    Pulmonary exam normal        Cardiovascular negative cardio ROS Normal cardiovascular exam     Neuro/Psych  Headaches    GI/Hepatic negative GI ROS, Neg liver ROS,,,  Endo/Other  negative endocrine ROS    Renal/GU negative Renal ROS     Musculoskeletal negative musculoskeletal ROS (+)    Abdominal   Peds  Hematology negative hematology ROS (+)   Anesthesia Other Findings Day of surgery medications reviewed with patient.  Reproductive/Obstetrics PPD#2                              Anesthesia Physical Anesthesia Plan  ASA: 2  Anesthesia Plan: General   Post-op Pain Management: Toradol  IV (intra-op)* and Ofirmev  IV (intra-op)*   Induction: Intravenous  PONV Risk Score and Plan: 3 and Treatment may vary due to age or medical condition, Ondansetron  and Dexamethasone   Airway Management Planned: Oral ETT  Additional Equipment: None  Intra-op Plan:   Post-operative Plan: Extubation in OR  Informed Consent: I have reviewed the patients History and Physical, chart, labs and discussed the procedure including the risks, benefits and alternatives for the proposed anesthesia with the patient or authorized representative who has indicated his/her understanding and acceptance.     Dental advisory given  Plan Discussed with: CRNA  Anesthesia Plan Comments: (Labor epidural has been off for 2 days since delivery. Gave patient options of spinal or GETA and she elects for GETA after discussion of R/B/A. All  questions answered. Jarrell Merritts, MD)         Anesthesia Quick Evaluation

## 2024-03-03 NOTE — Anesthesia Postprocedure Evaluation (Signed)
 Anesthesia Post Note  Patient: Charlotte Cohen  Procedure(s) Performed: LIGATION, FALLOPIAN TUBE, POSTPARTUM (Bilateral)     Patient location during evaluation: PACU Anesthesia Type: General Level of consciousness: awake and alert Pain management: pain level controlled Vital Signs Assessment: post-procedure vital signs reviewed and stable Respiratory status: spontaneous breathing, nonlabored ventilation and respiratory function stable Cardiovascular status: blood pressure returned to baseline Postop Assessment: no apparent nausea or vomiting Anesthetic complications: no   There were no known notable events for this encounter.  Last Vitals:  Vitals:   03/03/24 1145 03/03/24 1200  BP: 101/61 113/61  Pulse: 67 66  Resp: 15 13  Temp:  36.4 C  SpO2: (!) 89% 92%    Last Pain:  Vitals:   03/03/24 1200  TempSrc: Oral  PainSc: 2    Pain Goal:                   Rayfield Cairo

## 2024-03-03 NOTE — Op Note (Addendum)
 Operative Note   03/03/2024  PRE-OP DIAGNOSIS: Desire for permanent sterilization.  Postpartum Day #2   POST-OP DIAGNOSIS: Desired Infertility, Postpartum from SVD  SURGEON: Dr. Randel Buss   ASSISTANT: Ebony Goldstein, MD An experienced assistant was required given the standard of surgical care given the complexity of the case.  This assistant was needed for exposure, dissection, suctioning, retraction, instrument exchange, and for overall help during the procedure.  ANESTHESIA: General and local  PROCEDURE: mini-laparotomy, bilateral tubal ligation via Ligasure  ESTIMATED BLOOD LOSS: <4mL  DRAINS: none  SPECIMENS:  Portions of bilateral tubes to pathology  VTE PROPHYLAXIS: SCDs to the bilateral lower extremities  ANTIBIOTICS: not indicated  COMPLICATIONS: none  DISPOSITION: PACU - hemodynamically stable.  CONDITION: stable  FINDINGS: No intra-abdominal adhesions noted. Smooth, normally contoured uterine fundus and bilateral tubes. Normal appearing tubes and normal ovaries on palpation. +lumens noted on each cross section  PROCEDURE IN DETAIL: The patient was taken to the OR where anesthesia was administed. The patient was positioned in dorsal supine. The patient was prepped and draped in the normal sterile fashion a 4cm horizontal incision was made approximately 2 finger breadths below the umbilicus, after injection with local anesthesia. The skin was then incised with the scalpel and the underlying tissue dissected with the scapel and the fascia nicked in the midline with the scalpel and then extended laterally sharply.  The abdomen was then entered bluntly where a miniature Alexis was used for visualization and a moist lap sponge used to displace the bowel.   Attention was then turned to the patient's uterus, and left fallopian tube was identified and followed out to the fimbriated end.  Ligasure device was used to cauterize and cut the mesosalpinx to proximal end of the fallopian  tube, removing 6cm of tube. A similar process was carried out on the right side allowing for bilateral tubal sterilization.   The lap sponge was then removed from the abdomen and the fascia closed in running fashion with 0 vicryl and the subcutaneous tissue irrigated and the skin was then closed with 4-0 monocryl.     The patient tolerated the procedure well. All counts were correct x 2. The patient was transferred to the recovery room awake, alert and breathing independently.    Ebony Goldstein, MD FMOB Fellow, Faculty practice [facility], Center for [facility] 03/03/24  5:26 PM

## 2024-03-03 NOTE — Progress Notes (Addendum)
 Postpartum tubal consent:  23 y.o. [id number]  with undesired fertility,status post vaginal delivery, expressed desire for permanent sterilization.  I reviewed with the patient her expressed plan for tubal sterilization.  We dicussed other reversible forms of contraception including LARC options with similar effectiveness of BTS. She expressed that she strongly desire permanent sterilization. She declines all other modalities. We discussed method of sterilization with the preferred method being salpingectomy. Risks of procedure discussed with patient including but not limited to: risk of regret, permanence of method, bleeding, infection, injury to surrounding organs and need for additional procedures.  Failure risk of 0.5-1% with increased risk of ectopic gestation if pregnancy occurs was also discussed with patient.    We reviewed in detail the higher rate of regret for younger people and patient was very clear on her intent for sterilization. She had signed consent in March 2025.   To OR when ready NPO after midnight  Abner Ables, MD

## 2024-03-03 NOTE — Transfer of Care (Signed)
 Immediate Anesthesia Transfer of Care Note  Patient: Charlotte Cohen  Procedure(s) Performed: LIGATION, FALLOPIAN TUBE, POSTPARTUM (Bilateral)  Patient Location: PACU  Anesthesia Type:General  Level of Consciousness: sedated  Airway & Oxygen Therapy: Patient Spontanous Breathing and Patient connected to nasal cannula oxygen  Post-op Assessment: Report given to RN and Post -op Vital signs reviewed and stable  Post vital signs: Reviewed and stable  Last Vitals:  Vitals Value Taken Time  BP 106/60 03/03/24 11:30  Temp    Pulse 73 03/03/24 11:31  Resp 17 03/03/24 11:31  SpO2 94 % 03/03/24 11:31  Vitals shown include unfiled device data.  Last Pain:  Vitals:   03/03/24 0741  TempSrc:   PainSc: 6          Complications: There were no known notable events for this encounter.

## 2024-03-04 ENCOUNTER — Encounter (HOSPITAL_COMMUNITY): Payer: Self-pay | Admitting: Family Medicine

## 2024-03-07 ENCOUNTER — Ambulatory Visit: Payer: Self-pay | Admitting: Obstetrics and Gynecology

## 2024-03-07 LAB — SURGICAL PATHOLOGY

## 2024-03-08 ENCOUNTER — Inpatient Hospital Stay (HOSPITAL_COMMUNITY)

## 2024-03-08 ENCOUNTER — Encounter

## 2024-03-08 ENCOUNTER — Other Ambulatory Visit: Payer: Self-pay

## 2024-03-08 ENCOUNTER — Encounter (HOSPITAL_COMMUNITY): Payer: Self-pay | Admitting: Obstetrics & Gynecology

## 2024-03-08 ENCOUNTER — Inpatient Hospital Stay (HOSPITAL_COMMUNITY)
Admission: AD | Admit: 2024-03-08 | Discharge: 2024-03-08 | Disposition: A | Discharge status: Home / Self Care | Attending: Obstetrics & Gynecology | Admitting: Obstetrics & Gynecology

## 2024-03-08 ENCOUNTER — Ambulatory Visit (INDEPENDENT_AMBULATORY_CARE_PROVIDER_SITE_OTHER)

## 2024-03-08 DIAGNOSIS — Z9851 Tubal ligation status: Secondary | ICD-10-CM | POA: Insufficient documentation

## 2024-03-08 DIAGNOSIS — Y838 Other surgical procedures as the cause of abnormal reaction of the patient, or of later complication, without mention of misadventure at the time of the procedure: Secondary | ICD-10-CM | POA: Insufficient documentation

## 2024-03-08 DIAGNOSIS — T8140XA Infection following a procedure, unspecified, initial encounter: Secondary | ICD-10-CM | POA: Insufficient documentation

## 2024-03-08 DIAGNOSIS — L7682 Other postprocedural complications of skin and subcutaneous tissue: Secondary | ICD-10-CM

## 2024-03-08 DIAGNOSIS — Z09 Encounter for follow-up examination after completed treatment for conditions other than malignant neoplasm: Secondary | ICD-10-CM

## 2024-03-08 MED ORDER — OXYCODONE HCL 5 MG PO CAPS: 5.0000 mg | ORAL_CAPSULE | ORAL | 0 refills | Status: AC | PRN

## 2024-03-08 MED ORDER — DOCUSATE SODIUM 100 MG PO CAPS: 100.0000 mg | ORAL_CAPSULE | Freq: Two times a day (BID) | ORAL | 2 refills | Status: DC | PRN

## 2024-03-08 MED ORDER — IBUPROFEN 600 MG PO TABS
600.0000 mg | ORAL_TABLET | Freq: Once | ORAL | Status: AC
Start: 2024-03-08 — End: 2024-03-08
  Administered 2024-03-08: 600 mg via ORAL
  Filled 2024-03-08: qty 1

## 2024-03-08 NOTE — MAU Provider Note (Signed)
 History     CSN: [id number]  Arrival date and time: 03/08/24 1154   Event Date/Time   First Provider Initiated Contact with Patient 03/08/24 1303      Chief Complaint  Patient presents with   Post-op Problem   HPI Charlotte Cohen is a 23 y.o. year old [id number] female at 1 week postpartum after SVD and BTL who presents to MAU reporting feeling tightness and warmth behind the belly button. She was sent from the [facility] office after being seen for incision check. She was told by Dr. Alger that her incision did not look normal and she should come to MAU for evaluation. She also reports bilateral thigh swelling and sharp buttock pain while sitting and gallbladder pain. Pain rated 7/10. She reports normal BMs since delivery. She receives [facility] with [facility]; next appt is 04/01/2024.    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   WISDOM TOOTH EXTRACTION  2021   four;    Family History  Problem Relation Age of Onset   Depression Mother    Anxiety disorder Mother    Healthy Mother    Bipolar disorder Mother    Bipolar disorder Father    Depression Father    Anxiety disorder Father    Healthy Father    Healthy Sister    Healthy Sister    Healthy Brother    Healthy Brother    Healthy Brother    Eczema Brother    Cancer Maternal Grandmother        vaginal/uterine   Eczema Maternal  Grandfather    Eczema Paternal Grandmother    Eczema Paternal Grandfather     Social History   Tobacco Use   Smoking status: Never   Smokeless tobacco: Never  Vaping Use   Vaping status: Never Used  Substance Use Topics   Alcohol use: Never   Drug use: Never    Allergies: No Known Allergies  Medications Prior to Admission  Medication Sig Dispense Refill Last Dose/Taking   acetaminophen  (TYLENOL ) 325 MG tablet Take 2 tablets (650 mg total) by mouth every 4 (four) hours as needed (for pain scale < 4). 30 tablet 1 03/07/2024   Prenatal Vit-Fe Fumarate-FA (PRENATAL PLUS VITAMIN/MINERAL) 27-1 MG TABS Take 1 tablet by mouth daily. 30 tablet 12 03/08/2024   benzocaine -Menthol  (DERMOPLAST) 20-0.5 % AERO Apply 1 Application topically as needed for irritation (perineal discomfort).      Blood Pressure Monitoring (BLOOD PRESSURE KIT) DEVI 1 Device by Does not apply route once a week. (Patient not taking: Reported on 02/24/2024)  1 each 0    budesonide  (PULMICORT ) 180 MCG/ACT inhaler Inhale 1 puff into the lungs 2 (two) times daily. 1 each 2    budesonide -formoterol  (SYMBICORT ) 80-4.5 MCG/ACT inhaler Inhale 2 puffs into the lungs as needed (for cough, shortness of breath, wheezing). 1 each 12    cetirizine  (ZYRTEC  ALLERGY) 10 MG tablet Take 1 tablet (10 mg total) by mouth daily. 30 tablet 11    coconut oil OIL Apply 1 Application topically as needed.      dibucaine (NUPERCAINAL) 1 % OINT Place 1 Application rectally as needed for hemorrhoids.      docusate sodium  (COLACE) 100 MG capsule Take 1 capsule (100 mg total) by mouth 2 (two) times daily as needed. 30 capsule 2    fluticasone  (FLONASE ) 50 MCG/ACT nasal spray Place 2 sprays into both nostrils daily. 16 g 2    ibuprofen  (ADVIL ) 600 MG tablet Take 1 tablet (600 mg total) by mouth every 6 (six) hours. 90 tablet 1    metroNIDAZOLE  (FLAGYL ) 500 MG tablet Take 1 tablet (500 mg total) by mouth 2 (two) times daily. (Patient not taking: Reported on  02/24/2024) 14 tablet 0    montelukast  (SINGULAIR ) 10 MG tablet Take 1 tablet (10 mg total) by mouth at bedtime. 30 tablet 3    terconazole  (TERAZOL 7 ) 0.4 % vaginal cream Place 1 applicator vaginally at bedtime. Use for seven days (Patient not taking: Reported on 02/24/2024) 45 g 0    witch hazel-glycerin  (TUCKS) pad Apply 1 Application topically as needed for hemorrhoids. 40 each 12     Review of Systems  Constitutional: Negative.   HENT: Negative.    Eyes: Negative.   Respiratory: Negative.    Cardiovascular: Negative.   Gastrointestinal: Negative.        Pain at umbilicus where BTL  Endocrine: Negative.   Musculoskeletal: Negative.   Skin: Negative.   Allergic/Immunologic: Negative.   Neurological: Negative.   Hematological: Negative.   Psychiatric/Behavioral: Negative.     Physical Exam   Blood pressure (!) 116/47, pulse 86, temperature 97.8 F (36.6 C), temperature source Oral, resp. rate 20, height 5' 3 (1.6 m), weight 92.6 kg, SpO2 98%, currently breastfeeding.  Physical Exam Vitals and nursing note reviewed.  Constitutional:      Appearance: Normal appearance. She is obese.   Cardiovascular:     Rate and Rhythm: Normal rate.  Pulmonary:     Effort: Pulmonary effort is normal.  Abdominal:     Palpations: Abdomen is soft.     Tenderness: There is abdominal tenderness (around umbilicus) in the periumbilical area.     Comments: Area of ecchymosis inferior to umbilicus, tender to palpation, firm to palpation  Genitourinary:    Comments: Not indicated  Musculoskeletal:        General: Normal range of motion.   Skin:    General: Skin is warm and dry.   Neurological:     Mental Status: She is alert and oriented to person, place, and time.   Psychiatric:        Mood and Affect: Mood normal.        Behavior: Behavior normal.        Thought Content: Thought content normal.        Judgment: Judgment normal.    Reassessment @ 1420: Reviewed normal U/S results  with patient. Patient continues to use breast pump at bedside. Now feeling a little light-headed since it has been a while since she has eaten. Patient requesting  graham crackers, peanut butter and apple juice.  MAU Course  Procedures  MDM Miscellaneous U/S of lumbilicus area  US  Abdomen Limited Result Date: 03/08/2024 CLINICAL DATA:  Postop infection. EXAM: ULTRASOUND ABDOMEN LIMITED COMPARISON:  None Available. FINDINGS: Focused ultrasound of the area of concern near the umbilicus. Soft tissue edema identified in this location. No defined mass lesion or fluid collection. If there is further concern additional evaluation with CT may be useful for further delineation. IMPRESSION: Focused ultrasound of the area of concern near the umbilicus. Soft tissue edema identified in this location. No defined mass lesion or fluid collection. If there is further concern additional evaluation with CT may be useful for further delineation. Electronically Signed   By: Ranell Bring M.D.   On: 03/08/2024 14:12    Assessment and Plan  1. Pain at surgical incision (Primary) - Prescription for: Oxycodone  5 mg every 4 hrs prn severe pain at surgical site. - Continue Ibuprofen  600 mg po every 6 hrs. Advised to pick up Rx or go get refills that are at pharmacy.  2. History of bilateral tubal ligation - BTL incision C/D/I steri-strips intact  - Discharge home - Keep scheduled appt with [facility] on 04/01/2024 - Patient verbalized an understanding of the plan of care and agrees.   Ala Cart, CNM 03/08/2024, 1:03 PM

## 2024-03-08 NOTE — MAU Note (Signed)
 Charlotte Cohen is a 23 y.o. at Unknown here in MAU reporting: was sent from [facility] office after BTL incision check.  States area behind bellybutton is tight and warm, may be liquid build up behind incision. States MD said incision didn't look normal.  Also c.o bilateral thigh swelling and sharp buttock pain with sitting.  Reports has had normal BM's post delivery NVD 03/01/2024  LMP: NA Onset of complaint: today Pain score: 7 buttock Vitals:   03/08/24 1211  BP: (!) 113/56  Pulse: 70  Resp: 20  Temp: 97.8 F (36.6 C)  SpO2: 98%     FHT: NA  Lab orders placed from triage: None

## 2024-03-08 NOTE — Progress Notes (Signed)
 Subjective:     Charlotte Cohen is a 23 y.o. female who presents to the clinic 1 weeks status post BTL. Pt reports incision is covered. Honeycomb dressing in place, removed at visit today.      Objective:    BP 117/79   Pulse 89   LMP 05/22/2023   Breastfeeding Yes  General:  alert, well appearing, in no apparent distress  Incision:   no drainage, no hernia, no seroma, no dehiscence, incision well approximated, tender to palpitation, pt complains of severe pain.     Assessment:    Postoperative course complicated by severe pain near BTL site. No drainage but area tender, and tight. Dr. Alger assessed area and informed pt to report to MAU due to concerns of fluid collection behind incision.    Plan:    1. Continue any current medications. 2. Wound care discussed. 3. Follow up: Pt to report to MAU for evaluation.   Duwaine LOISE Galla, RN

## 2024-03-11 ENCOUNTER — Ambulatory Visit: Payer: Self-pay | Admitting: Surgery

## 2024-03-11 DIAGNOSIS — K8010 Calculus of gallbladder with chronic cholecystitis without obstruction: Secondary | ICD-10-CM | POA: Diagnosis not present

## 2024-03-11 NOTE — H&P (Signed)
 Subjective   Chief Complaint: New Consultation (cholelithiasis)     History of Present Illness: Charlotte Cohen is a 23 y.o. female who is seen today as an office consultation at the request of Dr. Nicholaus for evaluation of New Consultation (cholelithiasis) .    This is a 23 year old female who is 2 weeks postpartum who presents with a few months of intermittent severe right upper quadrant abdominal pain associated with nausea and bloating.  The pain radiates around her right side to her back.  She cannot remember any exacerbating event.  These episodes occur about once a month.  She has had 1 episode since delivering her baby.  She is occasionally breast-feeding but supplementing with formula.  In June she had an ultrasound that showed cholelithiasis with no sign of acute cholecystitis.  Liver function test were normal.   Review of Systems: A complete review of systems was obtained from the patient.  I have reviewed this information and discussed as appropriate with the patient.  See HPI as well for other ROS.  Review of Systems  Constitutional: Negative.   HENT: Negative.    Eyes: Negative.   Respiratory: Negative.    Cardiovascular: Negative.   Gastrointestinal:  Positive for abdominal pain and nausea.  Genitourinary:  Positive for flank pain.  Skin: Negative.   Neurological: Negative.   Endo/Heme/Allergies: Negative.   Psychiatric/Behavioral: Negative.        Medical History: History reviewed. No pertinent past medical history.  Patient Active Problem List  Diagnosis   Asthma (HHS-HCC)   Cholelithiasis   Gestational diabetes mellitus (GDM) affecting first pregnancy (HHS-HCC)   History of sleeve gastrectomy   Iron deficiency anemia secondary to inadequate dietary iron intake   Obesity affecting pregnancy (HHS-HCC)   Pre-diabetes    Past Surgical History:  Procedure Laterality Date   gastric sleeve     Tubal ligation (Bilateral)     wisdom tooth  extraction       No Known Allergies  Current Outpatient Medications on File Prior to Visit  Medication Sig Dispense Refill   cetirizine  (ZYRTEC ) 10 MG tablet Take 10 mg by mouth once daily     fluticasone  propionate (FLONASE ) 50 mcg/actuation nasal spray Place 2 sprays into both nostrils once daily     M-NATAL PLUS tablet Take 1 tablet by mouth once daily     montelukast  (SINGULAIR ) 10 mg tablet Take 10 mg by mouth at bedtime     No current facility-administered medications on file prior to visit.    Family History  Problem Relation Age of Onset   Anxiety Mother    Depression Mother    High blood pressure (Hypertension) Mother    Diabetes Mother    Anxiety Father    Depression Father      Social History   Tobacco Use  Smoking Status Never  Smokeless Tobacco Never     Social History   Socioeconomic History   Marital status: Married  Tobacco Use   Smoking status: Never   Smokeless tobacco: Never  Vaping Use   Vaping status: Never Used  Substance and Sexual Activity   Drug use: Never   Social Drivers of Corporate investment banker Strain: Low Risk  (04/03/2023)   Received from [facility]   Overall Financial Resource Strain (CARDIA)    Difficulty of Paying Living Expenses: Not hard at all  Food Insecurity: No Food Insecurity (03/02/2024)   Received from [facility]   Hunger Vital Sign  Within the past 12 months, you worried that your food would run out before you got the money to buy more.: Never true    Within the past 12 months, the food you bought just didn't last and you didn't have money to get more.: Never true  Transportation Needs: No Transportation Needs (03/02/2024)   Received from [facility] - Transportation    In the past 12 months, has lack of transportation kept you from medical appointments or from getting medications?: No    In the past 12 months, has lack of transportation kept you from meetings, work, or from getting things needed  for daily living?: No  Physical Activity: Insufficiently Active (07/18/2022)   Received from [facility]   Exercise Vital Sign    On average, how many days per week do you engage in moderate to strenuous exercise (like a brisk walk)?: 5 days    On average, how many minutes do you engage in exercise at this level?: 20 min  Stress: Patient Declined (04/03/2023)   Received from [facility] - Occupational Stress Questionnaire    Feeling of Stress : Patient declined  Social Connections: Moderately Integrated (04/03/2023)   Received from [facility]   Social Network    How would you rate your social network (family, work, friends)?: Adequate participation with social networks  Housing Stability: Low Risk  (04/03/2023)   Received from [facility] Stability Vital Sign    Unable to Pay for Housing in the Last Year: No    Number of Places Lived in the Last Year: 1    Unstable Housing in the Last Year: No    Objective:    Vitals:   03/11/24 1034  BP: 130/82  Pulse: 98  Temp: 36.7 C (98 F)  SpO2: 97%  Weight: 91.7 kg (202 lb 3.2 oz)  Height: 160 cm (5' 3)  PainSc: 0-No pain    Body mass index is 35.82 kg/m.  Physical Exam   Constitutional:  WDWN in NAD, conversant, no obvious deformities; lying in bed comfortably Eyes:  Pupils equal, round; sclera anicteric; moist conjunctiva; no lid lag HENT:  Oral mucosa moist; good dentition  Neck:  No masses palpated, trachea midline; no thyromegaly Lungs:  CTA bilaterally; normal respiratory effort CV:  Regular rate and rhythm; no murmurs; extremities well-perfused with no edema Abd:  +bowel sounds, soft, mildly tender in right upper quadrant no palpable organomegaly; no palpable hernias.  Healing periumbilical incision after tubal ligation Musc: Normal gait; no apparent clubbing or cyanosis in extremities Lymphatic:  No palpable cervical or axillary lymphadenopathy Skin:  Warm, dry;  no sign of jaundice Psychiatric - alert and oriented x 4; calm mood and affect   Labs, Imaging and Diagnostic Testing:   LFT's 02/17/24  T. Bili 0.8 AST 46 ALT 26 Alk phos 96  CLINICAL DATA:  [id number] Abdominal pain [id number]   EXAM: ULTRASOUND ABDOMEN LIMITED RIGHT UPPER QUADRANT   COMPARISON:  November 27, 2023   FINDINGS: Gallbladder:   Numerous small layering gallstones. No wall thickening or pericholecystic fluid. No sonographic Murphy's sign noted by sonographer.   Common bile duct:   Diameter: 5 mm   Liver:   Normal echogenicity. No focal lesion identified. No intrahepatic biliary ductal dilation. Portal vein is patent on color Doppler imaging with normal direction of blood flow towards the liver.   Other: None.   IMPRESSION: Numerous small layering gallstones. No  changes of acute cholecystitis.     Electronically Signed   By: Rogelia Myers M.D.   On: 02/17/2024 08:55  Assessment and Plan:  Diagnoses and all orders for this visit:  Calculus of gallbladder with chronic cholecystitis without obstruction    Recommend laparoscopic cholecystectomy with intraoperative cholangiogram.The surgical procedure has been discussed with the patient.  Potential risks, benefits, alternative treatments, and expected outcomes have been explained.  All of the patient's questions at this time have been answered.  The likelihood of reaching the patient's treatment goal is good.  The patient understands the proposed surgical procedure and wishes to proceed.  Amish Mintzer DEWAYNE LIMA, MD  03/11/2024 11:25 AM

## 2024-03-11 NOTE — H&P (View-Only) (Signed)
 Subjective   Chief Complaint: New Consultation (cholelithiasis)     History of Present Illness: Charlotte Cohen is a 23 y.o. female who is seen today as an office consultation at the request of Dr. Nicholaus for evaluation of New Consultation (cholelithiasis) .    This is a 23 year old female who is 2 weeks postpartum who presents with a few months of intermittent severe right upper quadrant abdominal pain associated with nausea and bloating.  The pain radiates around her right side to her back.  She cannot remember any exacerbating event.  These episodes occur about once a month.  She has had 1 episode since delivering her baby.  She is occasionally breast-feeding but supplementing with formula.  In June she had an ultrasound that showed cholelithiasis with no sign of acute cholecystitis.  Liver function test were normal.   Review of Systems: A complete review of systems was obtained from the patient.  I have reviewed this information and discussed as appropriate with the patient.  See HPI as well for other ROS.  Review of Systems  Constitutional: Negative.   HENT: Negative.    Eyes: Negative.   Respiratory: Negative.    Cardiovascular: Negative.   Gastrointestinal:  Positive for abdominal pain and nausea.  Genitourinary:  Positive for flank pain.  Skin: Negative.   Neurological: Negative.   Endo/Heme/Allergies: Negative.   Psychiatric/Behavioral: Negative.        Medical History: History reviewed. No pertinent past medical history.  Patient Active Problem List  Diagnosis   Asthma (HHS-HCC)   Cholelithiasis   Gestational diabetes mellitus (GDM) affecting first pregnancy (HHS-HCC)   History of sleeve gastrectomy   Iron deficiency anemia secondary to inadequate dietary iron intake   Obesity affecting pregnancy (HHS-HCC)   Pre-diabetes    Past Surgical History:  Procedure Laterality Date   gastric sleeve     Tubal ligation (Bilateral)     wisdom tooth  extraction       No Known Allergies  Current Outpatient Medications on File Prior to Visit  Medication Sig Dispense Refill   cetirizine  (ZYRTEC ) 10 MG tablet Take 10 mg by mouth once daily     fluticasone  propionate (FLONASE ) 50 mcg/actuation nasal spray Place 2 sprays into both nostrils once daily     M-NATAL PLUS tablet Take 1 tablet by mouth once daily     montelukast  (SINGULAIR ) 10 mg tablet Take 10 mg by mouth at bedtime     No current facility-administered medications on file prior to visit.    Family History  Problem Relation Age of Onset   Anxiety Mother    Depression Mother    High blood pressure (Hypertension) Mother    Diabetes Mother    Anxiety Father    Depression Father      Social History   Tobacco Use  Smoking Status Never  Smokeless Tobacco Never     Social History   Socioeconomic History   Marital status: Married  Tobacco Use   Smoking status: Never   Smokeless tobacco: Never  Vaping Use   Vaping status: Never Used  Substance and Sexual Activity   Drug use: Never   Social Drivers of Corporate investment banker Strain: Low Risk  (04/03/2023)   Received from [facility]   Overall Financial Resource Strain (CARDIA)    Difficulty of Paying Living Expenses: Not hard at all  Food Insecurity: No Food Insecurity (03/02/2024)   Received from [facility]   Hunger Vital Sign  Within the past 12 months, you worried that your food would run out before you got the money to buy more.: Never true    Within the past 12 months, the food you bought just didn't last and you didn't have money to get more.: Never true  Transportation Needs: No Transportation Needs (03/02/2024)   Received from [facility] - Transportation    In the past 12 months, has lack of transportation kept you from medical appointments or from getting medications?: No    In the past 12 months, has lack of transportation kept you from meetings, work, or from getting things needed  for daily living?: No  Physical Activity: Insufficiently Active (07/18/2022)   Received from [facility]   Exercise Vital Sign    On average, how many days per week do you engage in moderate to strenuous exercise (like a brisk walk)?: 5 days    On average, how many minutes do you engage in exercise at this level?: 20 min  Stress: Patient Declined (04/03/2023)   Received from [facility] - Occupational Stress Questionnaire    Feeling of Stress : Patient declined  Social Connections: Moderately Integrated (04/03/2023)   Received from [facility]   Social Network    How would you rate your social network (family, work, friends)?: Adequate participation with social networks  Housing Stability: Low Risk  (04/03/2023)   Received from [facility] Stability Vital Sign    Unable to Pay for Housing in the Last Year: No    Number of Places Lived in the Last Year: 1    Unstable Housing in the Last Year: No    Objective:    Vitals:   03/11/24 1034  BP: 130/82  Pulse: 98  Temp: 36.7 C (98 F)  SpO2: 97%  Weight: 91.7 kg (202 lb 3.2 oz)  Height: 160 cm (5' 3)  PainSc: 0-No pain    Body mass index is 35.82 kg/m.  Physical Exam   Constitutional:  WDWN in NAD, conversant, no obvious deformities; lying in bed comfortably Eyes:  Pupils equal, round; sclera anicteric; moist conjunctiva; no lid lag HENT:  Oral mucosa moist; good dentition  Neck:  No masses palpated, trachea midline; no thyromegaly Lungs:  CTA bilaterally; normal respiratory effort CV:  Regular rate and rhythm; no murmurs; extremities well-perfused with no edema Abd:  +bowel sounds, soft, mildly tender in right upper quadrant no palpable organomegaly; no palpable hernias.  Healing periumbilical incision after tubal ligation Musc: Normal gait; no apparent clubbing or cyanosis in extremities Lymphatic:  No palpable cervical or axillary lymphadenopathy Skin:  Warm, dry;  no sign of jaundice Psychiatric - alert and oriented x 4; calm mood and affect   Labs, Imaging and Diagnostic Testing:   LFT's 02/17/24  T. Bili 0.8 AST 46 ALT 26 Alk phos 96  CLINICAL DATA:  [id number] Abdominal pain [id number]   EXAM: ULTRASOUND ABDOMEN LIMITED RIGHT UPPER QUADRANT   COMPARISON:  November 27, 2023   FINDINGS: Gallbladder:   Numerous small layering gallstones. No wall thickening or pericholecystic fluid. No sonographic Murphy's sign noted by sonographer.   Common bile duct:   Diameter: 5 mm   Liver:   Normal echogenicity. No focal lesion identified. No intrahepatic biliary ductal dilation. Portal vein is patent on color Doppler imaging with normal direction of blood flow towards the liver.   Other: None.   IMPRESSION: Numerous small layering gallstones. No  changes of acute cholecystitis.     Electronically Signed   By: Rogelia Myers M.D.   On: 02/17/2024 08:55  Assessment and Plan:  Diagnoses and all orders for this visit:  Calculus of gallbladder with chronic cholecystitis without obstruction    Recommend laparoscopic cholecystectomy with intraoperative cholangiogram.The surgical procedure has been discussed with the patient.  Potential risks, benefits, alternative treatments, and expected outcomes have been explained.  All of the patient's questions at this time have been answered.  The likelihood of reaching the patient's treatment goal is good.  The patient understands the proposed surgical procedure and wishes to proceed.  Amish Mintzer DEWAYNE LIMA, MD  03/11/2024 11:25 AM

## 2024-03-15 ENCOUNTER — Encounter

## 2024-03-23 ENCOUNTER — Encounter (HOSPITAL_BASED_OUTPATIENT_CLINIC_OR_DEPARTMENT_OTHER): Payer: Self-pay | Admitting: Surgery

## 2024-03-23 ENCOUNTER — Other Ambulatory Visit: Payer: Self-pay

## 2024-03-25 ENCOUNTER — Other Ambulatory Visit: Payer: Self-pay

## 2024-03-25 ENCOUNTER — Encounter (HOSPITAL_COMMUNITY): Payer: Self-pay

## 2024-03-25 ENCOUNTER — Encounter (HOSPITAL_COMMUNITY)
Admission: RE | Admit: 2024-03-25 | Discharge: 2024-03-25 | Disposition: A | Discharge status: Home / Self Care | Source: Ambulatory Visit | Attending: Surgery

## 2024-03-25 VITALS — BP 119/67 | HR 66 | Temp 98.2°F | Resp 16 | Ht 63.0 in | Wt 196.2 lb

## 2024-03-25 DIAGNOSIS — Z01818 Encounter for other preprocedural examination: Secondary | ICD-10-CM

## 2024-03-25 DIAGNOSIS — Z01812 Encounter for preprocedural laboratory examination: Secondary | ICD-10-CM | POA: Insufficient documentation

## 2024-03-25 LAB — CBC
HCT: 40.8 % (ref 36.0–46.0)
Hemoglobin: 13.1 g/dL (ref 12.0–15.0)
MCH: 30.5 pg (ref 26.0–34.0)
MCHC: 32.1 g/dL (ref 30.0–36.0)
MCV: 95.1 fL (ref 80.0–100.0)
Platelets: 267 K/uL (ref 150–400)
RBC: 4.29 MIL/uL (ref 3.87–5.11)
RDW: 13.7 % (ref 11.5–15.5)
WBC: 7 K/uL (ref 4.0–10.5)
nRBC: 0 % (ref 0.0–0.2)

## 2024-03-25 NOTE — Progress Notes (Addendum)
 Anesthesia Review:  ERE:Charlotte Cohen  Cardiologist : none   PPM/ ICD: Device Orders: Rep Notified:  Chest x-ray : EKG : Echo : Stress test: Cardiac Cath :   Activity level: can do a flight of stairs without difficutly  Sleep Study/ CPAP : none  Fasting Blood Sugar :      / Checks Blood Sugar -- times a day:    Blood Thinner/ Instructions /Last Dose: ASA / Instructions/ Last Dose :  Delivered 03/01/24.  - Boy  Tubal ligation - 03/01/24.  Not breastfeeding per pt

## 2024-03-25 NOTE — Patient Instructions (Addendum)
 SURGICAL WAITING ROOM VISITATION  Patients having surgery or a procedure may have no more than 2 support people in the waiting area - these visitors may rotate.    Children under the age of 25 must have an adult with them who is not the patient.  Visitors with respiratory illnesses are discouraged from visiting and should remain at home.  If the patient needs to stay at the hospital during part of their recovery, the visitor guidelines for inpatient rooms apply. Pre-op nurse will coordinate an appropriate time for 1 support person to accompany patient in pre-op.  This support person may not rotate.    Please refer to the [facility] website for the visitor guidelines for Inpatients (after your surgery is over and you are in a regular room).       Your procedure is scheduled on:  03/30/2024    Report to [facility] Main Entrance    Report to admitting at   0800AM   Call this number if you have problems the morning of surgery [phone]   Do not eat food :After Midnight.   After Midnight you may have the following liquids until _ 0700_____ AM DAY OF SURGERY  Water  Non-Citrus Juices (without pulp, NO RED-Apple, White grape, White cranberry) Black Coffee (NO MILK/CREAM OR CREAMERS, sugar ok)  Clear Tea (NO MILK/CREAM OR CREAMERS, sugar ok) regular and decaf                             Plain Jell-O (NO RED)                                           Fruit ices (not with fruit pulp, NO RED)                                     Popsicles (NO RED)                                                               Sports drinks like Gatorade (NO RED)                   The day of surgery:  Drink ONE (1) Pre-Surgery Clear Ensure or G2 at 0700 AM  ( have completed by )  the morning of surgery. Drink in one sitting. Do not sip.  This drink was given to you during your hospital  pre-op appointment visit. Nothing else to drink after completing the  Pre-Surgery Clear Ensure or G2.           If you have questions, please contact your surgeon's office.       Oral Hygiene is also important to reduce your risk of infection.                                    Remember - BRUSH YOUR TEETH THE MORNING OF SURGERY WITH YOUR REGULAR TOOTHPASTE  DENTURES WILL BE REMOVED PRIOR TO SURGERY PLEASE DO  NOT APPLY Poly grip OR ADHESIVES!!!   Do NOT smoke after Midnight   Stop all vitamins and herbal supplements 7 days before surgery.   Take these medicines the morning of surgery with A SIP OF WATER :  inhalers as usual and bring, zyrtec , flonase , singulair    DO NOT TAKE ANY ORAL DIABETIC MEDICATIONS DAY OF YOUR SURGERY  Bring CPAP mask and tubing day of surgery.                              You may not have any metal on your body including hair pins, jewelry, and body piercing             Do not wear make-up, lotions, powders, perfumes/cologne, or deodorant  Do not wear nail polish including gel and S&S, artificial/acrylic nails, or any other type of covering on natural nails including finger and toenails. If you have artificial nails, gel coating, etc. that needs to be removed by a nail salon please have this removed prior to surgery or surgery may need to be canceled/ delayed if the surgeon/ anesthesia feels like they are unable to be safely monitored.   Do not shave  48 hours prior to surgery.               Men may shave face and neck.   Do not bring valuables to the hospital. [facility] IS NOT             RESPONSIBLE   FOR VALUABLES.   Contacts, glasses, dentures or bridgework may not be worn into surgery.   Bring small overnight bag day of surgery.   DO NOT BRING YOUR HOME MEDICATIONS TO THE HOSPITAL. PHARMACY WILL DISPENSE MEDICATIONS LISTED ON YOUR MEDICATION LIST TO YOU DURING YOUR ADMISSION IN THE HOSPITAL!    Patients discharged on the day of surgery will not be allowed to drive home.  Someone NEEDS to stay with you for the first 24 hours after  anesthesia.   Special Instructions: Bring a copy of your healthcare power of attorney and living will documents the day of surgery if you haven't scanned them before.              Please read over the following fact sheets you were given: IF YOU HAVE QUESTIONS ABOUT YOUR PRE-OP INSTRUCTIONS PLEASE CALL [phone]   If you received a COVID test during your pre-op visit  it is requested that you wear a mask when out in public, stay away from anyone that may not be feeling well and notify your surgeon if you develop symptoms. If you test positive for Covid or have been in contact with anyone that has tested positive in the last 10 days please notify you surgeon.    [facility] - Preparing for Surgery Before surgery, you can play an important role.  Because skin is not sterile, your skin needs to be as free of germs as possible.  You can reduce the number of germs on your skin by washing with CHG (chlorahexidine gluconate) soap before surgery.  CHG is an antiseptic cleaner which kills germs and bonds with the skin to continue killing germs even after washing. Please DO NOT use if you have an allergy to CHG or antibacterial soaps.  If your skin becomes reddened/irritated stop using the CHG and inform your nurse when you arrive at Short Stay. Do not shave (including legs and underarms) for at least 48 hours  prior to the first CHG shower.  You may shave your face/neck. Please follow these instructions carefully:  1.  Shower with CHG Soap the night before surgery and the  morning of Surgery.  2.  If you choose to wash your hair, wash your hair first as usual with your  normal  shampoo.  3.  After you shampoo, rinse your hair and body thoroughly to remove the  shampoo.                           4.  Use CHG as you would any other liquid soap.  You can apply chg directly  to the skin and wash                       Gently with a scrungie or clean washcloth.  5.  Apply the CHG Soap to your body ONLY FROM THE  NECK DOWN.   Do not use on face/ open                           Wound or open sores. Avoid contact with eyes, ears mouth and genitals (private parts).                       Wash face,  Genitals (private parts) with your normal soap.             6.  Wash thoroughly, paying special attention to the area where your surgery  will be performed.  7.  Thoroughly rinse your body with warm water  from the neck down.  8.  DO NOT shower/wash with your normal soap after using and rinsing off  the CHG Soap.                9.  Pat yourself dry with a clean towel.            10.  Wear clean pajamas.            11.  Place clean sheets on your bed the night of your first shower and do not  sleep with pets. Day of Surgery : Do not apply any lotions/deodorants the morning of surgery.  Please wear clean clothes to the hospital/surgery center.  FAILURE TO FOLLOW THESE INSTRUCTIONS MAY RESULT IN THE CANCELLATION OF YOUR SURGERY PATIENT SIGNATURE_________________________________  NURSE SIGNATURE__________________________________  ________________________________________________________________________

## 2024-03-30 ENCOUNTER — Encounter (HOSPITAL_COMMUNITY)
Admission: RE | Disposition: A | Discharge status: Home / Self Care | Payer: Self-pay | Source: Home / Self Care | Attending: Surgery

## 2024-03-30 ENCOUNTER — Other Ambulatory Visit: Payer: Self-pay

## 2024-03-30 ENCOUNTER — Ambulatory Visit (HOSPITAL_COMMUNITY): Payer: Self-pay | Admitting: Physician Assistant

## 2024-03-30 ENCOUNTER — Encounter (HOSPITAL_COMMUNITY): Payer: Self-pay | Admitting: Surgery

## 2024-03-30 ENCOUNTER — Ambulatory Visit (HOSPITAL_COMMUNITY)
Admission: RE | Admit: 2024-03-30 | Discharge: 2024-03-30 | Disposition: A | Discharge status: Home / Self Care | Attending: Surgery | Admitting: Surgery

## 2024-03-30 ENCOUNTER — Ambulatory Visit (HOSPITAL_COMMUNITY)

## 2024-03-30 ENCOUNTER — Ambulatory Visit (HOSPITAL_COMMUNITY): Admitting: Anesthesiology

## 2024-03-30 DIAGNOSIS — K8010 Calculus of gallbladder with chronic cholecystitis without obstruction: Secondary | ICD-10-CM | POA: Diagnosis not present

## 2024-03-30 DIAGNOSIS — K8020 Calculus of gallbladder without cholecystitis without obstruction: Secondary | ICD-10-CM | POA: Diagnosis not present

## 2024-03-30 DIAGNOSIS — K8018 Calculus of gallbladder with other cholecystitis without obstruction: Secondary | ICD-10-CM | POA: Diagnosis not present

## 2024-03-30 DIAGNOSIS — R932 Abnormal findings on diagnostic imaging of liver and biliary tract: Secondary | ICD-10-CM | POA: Diagnosis not present

## 2024-03-30 DIAGNOSIS — Z01818 Encounter for other preprocedural examination: Secondary | ICD-10-CM

## 2024-03-30 HISTORY — PX: CHOLECYSTECTOMY: SHX55

## 2024-03-30 LAB — POCT PREGNANCY, URINE: Preg Test, Ur: NEGATIVE

## 2024-03-30 SURGERY — LAPAROSCOPIC CHOLECYSTECTOMY WITH INTRAOPERATIVE CHOLANGIOGRAM
Anesthesia: General

## 2024-03-30 MED ORDER — DEXAMETHASONE SODIUM PHOSPHATE 10 MG/ML IJ SOLN
INTRAMUSCULAR | Status: AC
  Filled 2024-03-30: qty 1

## 2024-03-30 MED ORDER — FENTANYL CITRATE (PF) 100 MCG/2ML IJ SOLN
INTRAMUSCULAR | Status: AC
  Filled 2024-03-30: qty 2

## 2024-03-30 MED ORDER — ONDANSETRON HCL 4 MG/2ML IJ SOLN
INTRAMUSCULAR | Status: AC
Start: 2024-03-30 — End: 2024-03-30
  Filled 2024-03-30: qty 2

## 2024-03-30 MED ORDER — ROCURONIUM BROMIDE 100 MG/10ML IV SOLN
INTRAVENOUS | Status: DC | PRN
  Administered 2024-03-30: 60 mg via INTRAVENOUS

## 2024-03-30 MED ORDER — FENTANYL CITRATE (PF) 100 MCG/2ML IJ SOLN
INTRAMUSCULAR | Status: DC | PRN
  Administered 2024-03-30 (×2): 25 ug via INTRAVENOUS
  Administered 2024-03-30: 50 ug via INTRAVENOUS

## 2024-03-30 MED ORDER — HYDROMORPHONE HCL 1 MG/ML IJ SOLN
INTRAMUSCULAR | Status: AC
  Filled 2024-03-30: qty 1

## 2024-03-30 MED ORDER — 0.9 % SODIUM CHLORIDE (POUR BTL) OPTIME
TOPICAL | Status: DC | PRN
  Administered 2024-03-30: 1000 mL

## 2024-03-30 MED ORDER — SUGAMMADEX SODIUM 200 MG/2ML IV SOLN
INTRAVENOUS | Status: DC | PRN
  Administered 2024-03-30: 100 mg via INTRAVENOUS

## 2024-03-30 MED ORDER — PROPOFOL 10 MG/ML IV BOLUS
INTRAVENOUS | Status: AC
Start: 2024-03-30 — End: 2024-03-30
  Filled 2024-03-30: qty 20

## 2024-03-30 MED ORDER — BUPIVACAINE-EPINEPHRINE (PF) 0.25% -1:200000 IJ SOLN
INTRAMUSCULAR | Status: AC
  Filled 2024-03-30: qty 30

## 2024-03-30 MED ORDER — DEXAMETHASONE SODIUM PHOSPHATE 10 MG/ML IJ SOLN
INTRAMUSCULAR | Status: DC | PRN
  Administered 2024-03-30: 10 mg via INTRAVENOUS

## 2024-03-30 MED ORDER — CHLORHEXIDINE GLUCONATE CLOTH 2 % EX PADS: 6.0000 | MEDICATED_PAD | Freq: Once | CUTANEOUS | Status: DC

## 2024-03-30 MED ORDER — LACTATED RINGERS IV SOLN: INTRAVENOUS | Status: DC

## 2024-03-30 MED ORDER — MIDAZOLAM HCL 2 MG/2ML IJ SOLN
INTRAMUSCULAR | Status: AC
  Filled 2024-03-30: qty 2

## 2024-03-30 MED ORDER — LIDOCAINE HCL (PF) 2 % IJ SOLN
INTRAMUSCULAR | Status: AC
  Filled 2024-03-30: qty 5

## 2024-03-30 MED ORDER — PHENYLEPHRINE 80 MCG/ML (10ML) SYRINGE FOR IV PUSH (FOR BLOOD PRESSURE SUPPORT)
PREFILLED_SYRINGE | INTRAVENOUS | Status: AC
  Filled 2024-03-30: qty 10

## 2024-03-30 MED ORDER — SODIUM CHLORIDE (PF) 0.9 % IJ SOLN
INTRAMUSCULAR | Status: DC | PRN
  Administered 2024-03-30: 10 mL

## 2024-03-30 MED ORDER — BUPIVACAINE-EPINEPHRINE 0.25% -1:200000 IJ SOLN
INTRAMUSCULAR | Status: DC | PRN
  Administered 2024-03-30: 15 mL

## 2024-03-30 MED ORDER — HYDROMORPHONE HCL 1 MG/ML IJ SOLN
0.2500 mg | INTRAMUSCULAR | Status: DC | PRN
  Administered 2024-03-30: 0.5 mg via INTRAVENOUS

## 2024-03-30 MED ORDER — ROCURONIUM BROMIDE 10 MG/ML (PF) SYRINGE
PREFILLED_SYRINGE | INTRAVENOUS | Status: AC
Start: 2024-03-30 — End: 2024-03-30
  Filled 2024-03-30: qty 10

## 2024-03-30 MED ORDER — PHENYLEPHRINE 80 MCG/ML (10ML) SYRINGE FOR IV PUSH (FOR BLOOD PRESSURE SUPPORT)
PREFILLED_SYRINGE | INTRAVENOUS | Status: DC | PRN
  Administered 2024-03-30: 80 ug via INTRAVENOUS

## 2024-03-30 MED ORDER — OXYCODONE HCL 5 MG/5ML PO SOLN: 5.0000 mg | Freq: Once | ORAL | Status: AC | PRN

## 2024-03-30 MED ORDER — CEFAZOLIN SODIUM-DEXTROSE 2-4 GM/100ML-% IV SOLN
2.0000 g | INTRAVENOUS | Status: DC
  Filled 2024-03-30: qty 100

## 2024-03-30 MED ORDER — OXYCODONE HCL 5 MG PO TABS
5.0000 mg | ORAL_TABLET | Freq: Four times a day (QID) | ORAL | 0 refills | Status: AC | PRN
Start: 2024-03-30 — End: ?

## 2024-03-30 MED ORDER — ONDANSETRON HCL 4 MG/2ML IJ SOLN
INTRAMUSCULAR | Status: DC | PRN
  Administered 2024-03-30: 4 mg via INTRAVENOUS

## 2024-03-30 MED ORDER — OXYCODONE HCL 5 MG PO TABS
ORAL_TABLET | ORAL | Status: AC
  Filled 2024-03-30: qty 1

## 2024-03-30 MED ORDER — LIDOCAINE HCL (CARDIAC) PF 100 MG/5ML IV SOSY
PREFILLED_SYRINGE | INTRAVENOUS | Status: DC | PRN
  Administered 2024-03-30: 100 mg via INTRAVENOUS

## 2024-03-30 MED ORDER — ALBUTEROL SULFATE HFA 108 (90 BASE) MCG/ACT IN AERS
INHALATION_SPRAY | RESPIRATORY_TRACT | Status: DC | PRN
  Administered 2024-03-30: 6 via RESPIRATORY_TRACT

## 2024-03-30 MED ORDER — SODIUM CHLORIDE 0.9 % IV SOLN: 12.5000 mg | INTRAVENOUS | Status: DC | PRN

## 2024-03-30 MED ORDER — OXYCODONE HCL 5 MG PO TABS
5.0000 mg | ORAL_TABLET | Freq: Once | ORAL | Status: AC | PRN
  Administered 2024-03-30: 5 mg via ORAL

## 2024-03-30 MED ORDER — AMISULPRIDE (ANTIEMETIC) 5 MG/2ML IV SOLN: 10.0000 mg | Freq: Once | INTRAVENOUS | Status: DC | PRN

## 2024-03-30 MED ORDER — CHLORHEXIDINE GLUCONATE 0.12 % MT SOLN
15.0000 mL | Freq: Once | OROMUCOSAL | Status: AC
  Administered 2024-03-30: 15 mL via OROMUCOSAL

## 2024-03-30 MED ORDER — EPHEDRINE 5 MG/ML INJ
INTRAVENOUS | Status: AC
  Filled 2024-03-30: qty 5

## 2024-03-30 MED ORDER — ORAL CARE MOUTH RINSE: 15.0000 mL | Freq: Once | OROMUCOSAL | Status: AC

## 2024-03-30 MED ORDER — KETOROLAC TROMETHAMINE 30 MG/ML IJ SOLN
INTRAMUSCULAR | Status: DC | PRN
  Administered 2024-03-30: 30 mg via INTRAVENOUS

## 2024-03-30 MED ORDER — PROPOFOL 10 MG/ML IV BOLUS
INTRAVENOUS | Status: DC | PRN
  Administered 2024-03-30: 150 mg via INTRAVENOUS

## 2024-03-30 MED ORDER — KETOROLAC TROMETHAMINE 30 MG/ML IJ SOLN
INTRAMUSCULAR | Status: AC
  Filled 2024-03-30: qty 1

## 2024-03-30 MED ORDER — LACTATED RINGERS IR SOLN
Status: DC | PRN
  Administered 2024-03-30: 1000 mL

## 2024-03-30 MED ORDER — STERILE WATER FOR IRRIGATION IR SOLN
Status: DC | PRN
Start: 2024-03-30 — End: 2024-03-30
  Administered 2024-03-30: 1000 mL

## 2024-03-30 MED ORDER — ACETAMINOPHEN 500 MG PO TABS
1000.0000 mg | ORAL_TABLET | ORAL | Status: AC
  Administered 2024-03-30: 1000 mg via ORAL
  Filled 2024-03-30: qty 2

## 2024-03-30 MED ORDER — MEPERIDINE HCL 25 MG/ML IJ SOLN: 6.2500 mg | INTRAMUSCULAR | Status: DC | PRN

## 2024-03-30 MED ORDER — MIDAZOLAM HCL 5 MG/5ML IJ SOLN
INTRAMUSCULAR | Status: DC | PRN
  Administered 2024-03-30: 2 mg via INTRAVENOUS

## 2024-03-30 SURGICAL SUPPLY — 32 items
BENZOIN TINCTURE PRP APPL 2/3 (GAUZE/BANDAGES/DRESSINGS) ×1 IMPLANT
CABLE HIGH FREQUENCY MONO STRZ (ELECTRODE) ×1 IMPLANT
CHLORAPREP W/TINT 26 (MISCELLANEOUS) ×1 IMPLANT
CLIP APPLIE ROT 10 11.4 M/L (STAPLE) ×1 IMPLANT
COVER MAYO STAND XLG (MISCELLANEOUS) ×1 IMPLANT
COVER SURGICAL LIGHT HANDLE (MISCELLANEOUS) ×1 IMPLANT
DRAPE C-ARM 42X120 X-RAY (DRAPES) ×1 IMPLANT
DRSG TEGADERM 2-3/8X2-3/4 SM (GAUZE/BANDAGES/DRESSINGS) ×3 IMPLANT
DRSG TEGADERM 4X4.75 (GAUZE/BANDAGES/DRESSINGS) ×1 IMPLANT
ELECT REM PT RETURN 15FT ADLT (MISCELLANEOUS) ×1 IMPLANT
GAUZE SPONGE 2X2 8PLY STRL LF (GAUZE/BANDAGES/DRESSINGS) IMPLANT
GLOVE BIO SURGEON STRL SZ7 (GLOVE) ×1 IMPLANT
GLOVE BIOGEL PI IND STRL 7.5 (GLOVE) ×1 IMPLANT
GOWN STRL REUS W/ TWL LRG LVL3 (GOWN DISPOSABLE) ×1 IMPLANT
IRRIGATION SUCT STRKRFLW 2 WTP (MISCELLANEOUS) ×1 IMPLANT
KIT BASIN OR (CUSTOM PROCEDURE TRAY) ×1 IMPLANT
KIT TURNOVER KIT A (KITS) ×1 IMPLANT
NS IRRIG 1000ML POUR BTL (IV SOLUTION) ×1 IMPLANT
POUCH RETRIEVAL ECOSAC 10 (ENDOMECHANICALS) IMPLANT
SCISSORS LAP 5X35 DISP (ENDOMECHANICALS) ×1 IMPLANT
SET CHOLANGIOGRAPH MIX (MISCELLANEOUS) ×1 IMPLANT
SET TUBE SMOKE EVAC HIGH FLOW (TUBING) ×1 IMPLANT
SLEEVE Z-THREAD 5X100MM (TROCAR) ×1 IMPLANT
SPIKE FLUID TRANSFER (MISCELLANEOUS) ×1 IMPLANT
STRIP CLOSURE SKIN 1/2X4 (GAUZE/BANDAGES/DRESSINGS) ×1 IMPLANT
SUT MNCRL AB 4-0 PS2 18 (SUTURE) ×1 IMPLANT
SYSTEM BAG RETRIEVAL 10MM (BASKET) IMPLANT
TOWEL OR 17X26 10 PK STRL BLUE (TOWEL DISPOSABLE) ×1 IMPLANT
TRAY LAPAROSCOPIC (CUSTOM PROCEDURE TRAY) ×1 IMPLANT
TROCAR 11X100 Z THREAD (TROCAR) ×1 IMPLANT
TROCAR BALLN 12MMX100 BLUNT (TROCAR) ×1 IMPLANT
TROCAR Z-THREAD OPTICAL 5X100M (TROCAR) ×1 IMPLANT

## 2024-03-30 NOTE — Anesthesia Procedure Notes (Signed)
 Procedure Name: Intubation Date/Time: 03/30/2024 10:22 AM  Performed by: Nada Corean CROME, CRNAPre-anesthesia Checklist: Suction available, Emergency Drugs available, Patient identified, Timeout performed and Patient being monitored Patient Re-evaluated:Patient Re-evaluated prior to induction Oxygen Delivery Method: Circle system utilized Preoxygenation: Pre-oxygenation with 100% oxygen Induction Type: IV induction Ventilation: Mask ventilation without difficulty Laryngoscope Size: Mac and 3 Grade View: Grade I Tube type: Oral Tube size: 7.0 mm Number of attempts: 1 Airway Equipment and Method: Stylet Placement Confirmation: ETT inserted through vocal cords under direct vision, positive ETCO2 and breath sounds checked- equal and bilateral Secured at: 21 cm Tube secured with: Tape Dental Injury: Teeth and Oropharynx as per pre-operative assessment

## 2024-03-30 NOTE — Anesthesia Postprocedure Evaluation (Signed)
 Anesthesia Post Note  Patient: Charlotte Cohen  Procedure(s) Performed: LAPAROSCOPIC CHOLECYSTECTOMY WITH INTRAOPERATIVE CHOLANGIOGRAM     Patient location during evaluation: PACU Anesthesia Type: General Level of consciousness: awake and alert Pain management: pain level controlled Vital Signs Assessment: post-procedure vital signs reviewed and stable Respiratory status: spontaneous breathing, nonlabored ventilation and respiratory function stable Cardiovascular status: blood pressure returned to baseline and stable Postop Assessment: no apparent nausea or vomiting Anesthetic complications: no   No notable events documented.  Last Vitals:  Vitals:   03/30/24 1230 03/30/24 1245  BP: 125/80 123/68  Pulse: 82 80  Resp: 16 14  Temp:    SpO2: 96% 96%    Last Pain:  Vitals:   03/30/24 1245  TempSrc:   PainSc: 3                  Butler Levander Pinal

## 2024-03-30 NOTE — Discharge Instructions (Signed)
 [facility] ______[facility] LAPAROSCOPIC SURGERY: POST OP INSTRUCTIONS Always review your discharge instruction sheet given to you by the facility where your surgery was performed. IF YOU HAVE DISABILITY OR FAMILY LEAVE FORMS, YOU MUST BRING THEM TO THE OFFICE FOR PROCESSING.   DO NOT GIVE THEM TO YOUR DOCTOR.  A prescription for pain medication may be given to you upon discharge.  Take your pain medication as prescribed, if needed.  If narcotic pain medicine is not needed, then you may take acetaminophen (Tylenol) or ibuprofen (Advil) as needed. Take your usually prescribed medications unless otherwise directed. If you need a refill on your pain medication, please contact your pharmacy.  They will contact our office to request authorization. Prescriptions will not be filled after 5pm or on week-ends. You should follow a light diet the first few days after arrival home, such as soup and crackers, etc.  Be sure to include lots of fluids daily. Most patients will experience some swelling and bruising in the area of the incisions.  Ice packs will help.  Swelling and bruising can take several days to resolve.  It is common to experience some constipation if taking pain medication after surgery.  Increasing fluid intake and taking a stool softener (such as Colace) will usually help or prevent this problem from occurring.  A mild laxative (Milk of Magnesia or Miralax) should be taken according to package instructions if there are no bowel movements after 48 hours. Unless discharge instructions indicate otherwise, you may remove your bandages 24-48 hours after surgery, and you may shower at that time.  You may have steri-strips (small skin tapes) in place directly over the incision.  These strips should be left on the skin for 7-10 days.  If your surgeon used skin glue on the incision, you may shower in 24 hours.  The glue will flake off over the next 2-3 weeks.  Any sutures or staples will be  removed at the office during your follow-up visit. ACTIVITIES:  You may resume regular (light) daily activities beginning the next day--such as daily self-care, walking, climbing stairs--gradually increasing activities as tolerated.  You may have sexual intercourse when it is comfortable.  Refrain from any heavy lifting or straining until approved by your doctor. You may drive when you are no longer taking prescription pain medication, you can comfortably wear a seatbelt, and you can safely maneuver your car and apply brakes. RETURN TO WORK:  __________________________________________________________ Bonita Quin should see your doctor in the office for a follow-up appointment approximately 2-3 weeks after your surgery.  Make sure that you call for this appointment within a day or two after you arrive home to insure a convenient appointment time. OTHER INSTRUCTIONS: __________________________________________________________________________________________________________________________ __________________________________________________________________________________________________________________________ WHEN TO CALL YOUR DOCTOR: Fever over 101.0 Inability to urinate Continued bleeding from incision. Increased pain, redness, or drainage from the incision. Increasing abdominal pain  The clinic staff is available to answer your questions during regular business hours.  Please don't hesitate to call and ask to speak to one of the nurses for clinical concerns.  If you have a medical emergency, go to the nearest emergency room or call 911.  A surgeon from [facility] is always on call at the hospital. [address], [address], [place], [place]  [address] ? [address] [address], [place], [place]   [address] [phone] ? [phone] ? FAX [phone] Web site: [email or web address]

## 2024-03-30 NOTE — Interval H&P Note (Signed)
 History and Physical Interval Note:  03/30/2024 9:43 AM  Charlotte Cohen  has presented today for surgery, with the diagnosis of CHRONIC CALCULUS CHOLECYSTITIS.  The various methods of treatment have been discussed with the patient and family. After consideration of risks, benefits and other options for treatment, the patient has consented to  Procedure(s) with comments: LAPAROSCOPIC CHOLECYSTECTOMY WITH INTRAOPERATIVE CHOLANGIOGRAM (N/A) - C-ARM as a surgical intervention.  The patient's history has been reviewed, patient examined, no change in status, stable for surgery.  I have reviewed the patient's chart and labs.  Questions were answered to the patient's satisfaction.     Donnice MARLA Lima

## 2024-03-30 NOTE — Op Note (Signed)
 Laparoscopic Cholecystectomy with IOC Procedure Note  Indications: This is a 23 year old female who is 2 weeks postpartum who presents with a few months of intermittent severe right upper quadrant abdominal pain associated with nausea and bloating. The pain radiates around her right side to her back. She cannot remember any exacerbating event. These episodes occur about once a month. She has had 1 episode since delivering her baby. She is occasionally breast-feeding but supplementing with formula. In June she had an ultrasound that showed cholelithiasis with no sign of acute cholecystitis. Liver function tests were normal.   Pre-operative Diagnosis: Calculus of gallbladder with other cholecystitis, without mention of obstruction  Post-operative Diagnosis: Same  Surgeon: Donnice MARLA Lima   Resident:  Dr. Mae Flock I was personally present during the key and critical portions of this procedure and immediately available throughout the entire procedure, as documented in my operative note.   Anesthesia: General endotracheal anesthesia  ASA Class: 2  Procedure Details  The patient was seen again in the Holding Room. The risks, benefits, complications, treatment options, and expected outcomes were discussed with the patient. The possibilities of reaction to medication, pulmonary aspiration, perforation of viscus, bleeding, recurrent infection, finding a normal gallbladder, the need for additional procedures, failure to diagnose a condition, the possible need to convert to an open procedure, and creating a complication requiring transfusion or operation were discussed with the patient. The likelihood of improving the patient's symptoms with return to their baseline status is good.  The patient and/or family concurred with the proposed plan, giving informed consent. The site of surgery properly noted. The patient was taken to Operating Room, identified as Charlotte Cohen and the procedure  verified as Laparoscopic Cholecystectomy with Intraoperative Cholangiogram. A Time Out was held and the above information confirmed.  Prior to the induction of general anesthesia, antibiotic prophylaxis was administered. General endotracheal anesthesia was then administered and tolerated well. After the induction, the abdomen was prepped with Chloraprep and draped in the sterile fashion. The patient was positioned in the supine position.  Local anesthetic agent was injected into the skin near the umbilicus and an incision made through her old umbilical scar. We dissected down to the abdominal fascia with blunt dissection.  The fascia was incised vertically and we entered the peritoneal cavity bluntly.  A pursestring suture of 0-Vicryl was placed around the fascial opening.  The Hasson cannula was inserted and secured with the stay suture.  Pneumoperitoneum was then created with CO2 and tolerated well without any adverse changes in the patient's vital signs. An 11-mm port was placed in the subxiphoid position.  Two 5-mm ports were placed in the right upper quadrant. All skin incisions were infiltrated with a local anesthetic agent before making the incision and placing the trocars.   We positioned the patient in reverse Trendelenburg, tilted slightly to the patient's left.  The gallbladder was identified, the fundus grasped and retracted cephalad. Adhesions were lysed bluntly and with the electrocautery where indicated, taking care not to injure any adjacent organs or viscus. The infundibulum was grasped and retracted laterally, exposing the peritoneum overlying the triangle of Calot. This was then divided and exposed in a blunt fashion. A critical view of the cystic duct and cystic artery was obtained.  The cystic duct was clearly identified and bluntly dissected circumferentially. The cystic duct was ligated with a clip distally.   An incision was made in the cystic duct and the [facility] cholangiogram catheter  introduced. The catheter was secured  using a clip. A cholangiogram was then obtained which showed good visualization of the distal and proximal biliary tree with no sign of filling defects or obstruction.  Contrast flowed easily into the duodenum. The catheter was then removed.   The cystic duct was then ligated with clips and divided. The cystic artery was identified, dissected free, ligated with clips and divided as well.   The gallbladder was dissected from the liver bed in retrograde fashion with the electrocautery. The gallbladder was removed and placed in an Eco sac. The liver bed was irrigated and inspected. Hemostasis was achieved with the electrocautery. Copious irrigation was utilized and was repeatedly aspirated until clear.  The gallbladder and Eco sac were then removed through the umbilical port site.  The pursestring suture was used to close the umbilical fascia.    We again inspected the right upper quadrant for hemostasis.  Pneumoperitoneum was released as we removed the trocars.  4-0 Monocryl was used to close the skin.   Benzoin, steri-strips, and clean dressings were applied. The patient was then extubated and brought to the recovery room in stable condition. Instrument, sponge, and needle counts were correct at closure and at the conclusion of the case.   Findings: Cholecystitis with Cholelithiasis  Estimated Blood Loss: Minimal         Drains: none         Specimens: Gallbladder           Complications: None; patient tolerated the procedure well.         Disposition: PACU - hemodynamically stable.         Condition: stable  Donnice POUR. Belinda, MD, [facility]  General Surgery   03/30/2024 11:33 AM

## 2024-03-30 NOTE — Transfer of Care (Signed)
 Immediate Anesthesia Transfer of Care Note  Patient: Charlotte Cohen  Procedure(s) Performed: LAPAROSCOPIC CHOLECYSTECTOMY WITH INTRAOPERATIVE CHOLANGIOGRAM  Patient Location: PACU  Anesthesia Type:General  Level of Consciousness: awake, drowsy, and patient cooperative  Airway & Oxygen Therapy: Patient Spontanous Breathing and Patient connected to face mask oxygen  Post-op Assessment: Report given to RN and Post -op Vital signs reviewed and stable  Post vital signs: Reviewed and stable  Last Vitals:  Vitals Value Taken Time  BP 120/64 03/30/24 11:54  Temp    Pulse 91 03/30/24 11:55  Resp 22 03/30/24 11:55  SpO2 100 % 03/30/24 11:55  Vitals shown include unfiled device data.  Last Pain:  Vitals:   03/30/24 0839  TempSrc:   PainSc: 0-No pain         Complications: No notable events documented.

## 2024-03-30 NOTE — Anesthesia Preprocedure Evaluation (Signed)
 Anesthesia Evaluation  Patient identified by MRN, date of birth, ID band Patient awake    Reviewed: Allergy & Precautions, NPO status , Patient's Chart, lab work & pertinent test results  History of Anesthesia Complications Negative for: history of anesthetic complications  Airway Mallampati: II  TM Distance: >3 FB Neck ROM: Full    Dental no notable dental hx.    Pulmonary asthma    Pulmonary exam normal        Cardiovascular negative cardio ROS Normal cardiovascular exam     Neuro/Psych  Headaches    GI/Hepatic negative GI ROS, Neg liver ROS,,,  Endo/Other  negative endocrine ROS    Renal/GU negative Renal ROS     Musculoskeletal negative musculoskeletal ROS (+)    Abdominal  (+) + obese  Peds  Hematology negative hematology ROS (+)   Anesthesia Other Findings Day of surgery medications reviewed with patient.  Reproductive/Obstetrics PPD#2                              Anesthesia Physical Anesthesia Plan  ASA: 2  Anesthesia Plan: General   Post-op Pain Management: Tylenol  PO (pre-op)*   Induction: Intravenous  PONV Risk Score and Plan: 3 and Treatment may vary due to age or medical condition, Ondansetron , Dexamethasone  and Midazolam   Airway Management Planned: Oral ETT  Additional Equipment: None  Intra-op Plan:   Post-operative Plan: Extubation in OR  Informed Consent: I have reviewed the patients History and Physical, chart, labs and discussed the procedure including the risks, benefits and alternatives for the proposed anesthesia with the patient or authorized representative who has indicated his/her understanding and acceptance.     Dental advisory given  Plan Discussed with: CRNA  Anesthesia Plan Comments:          Anesthesia Quick Evaluation

## 2024-03-31 ENCOUNTER — Encounter (HOSPITAL_COMMUNITY): Payer: Self-pay | Admitting: Surgery

## 2024-03-31 LAB — SURGICAL PATHOLOGY

## 2024-04-01 ENCOUNTER — Ambulatory Visit: Admitting: Obstetrics and Gynecology

## 2024-04-28 DIAGNOSIS — H52223 Regular astigmatism, bilateral: Secondary | ICD-10-CM | POA: Diagnosis not present

## 2024-04-28 DIAGNOSIS — H5203 Hypermetropia, bilateral: Secondary | ICD-10-CM | POA: Diagnosis not present

## 2024-04-29 DIAGNOSIS — H5213 Myopia, bilateral: Secondary | ICD-10-CM | POA: Diagnosis not present

## 2024-05-18 IMAGING — CT CT HEAD W/O CM
4 series · 16 of 47 positions shown, 18 images · non-contrast
Comparison: None Available.

CLINICAL DATA: Chronic headaches, increased frequency



[Series 2: head wo · axial · 0.33mm/px · z∈[-123,-8]mm · 7 of 34 slices shown, 9 images]
[im 5/34  brain]
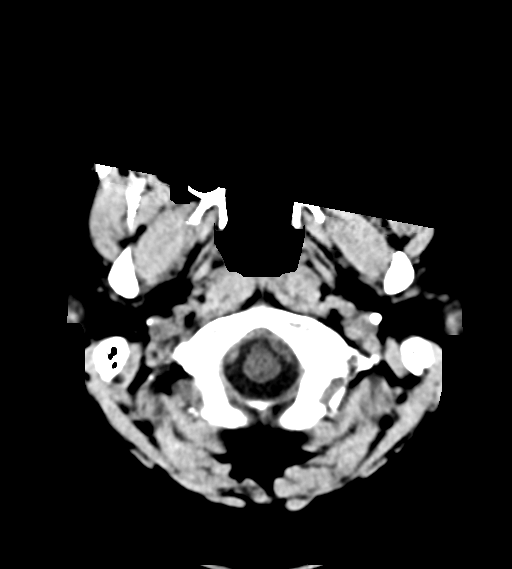
[im 5/34  bone]
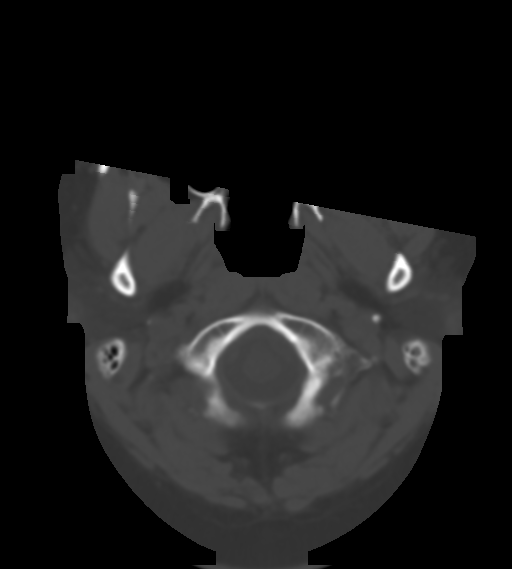
[im 9/34  brain]
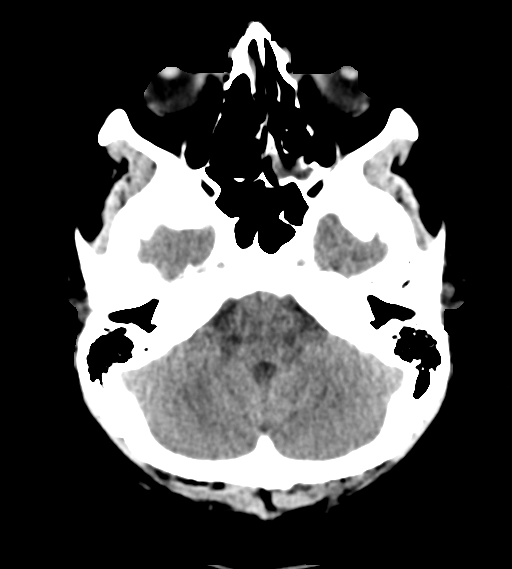
[im 13/34  brain]
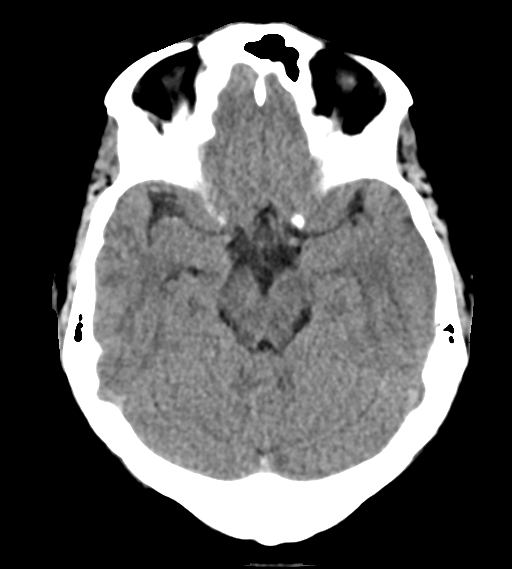
[im 17/34  brain]
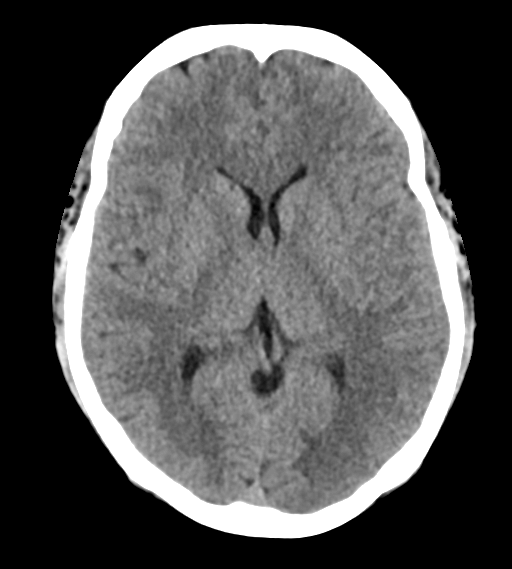
[im 21/34  brain]
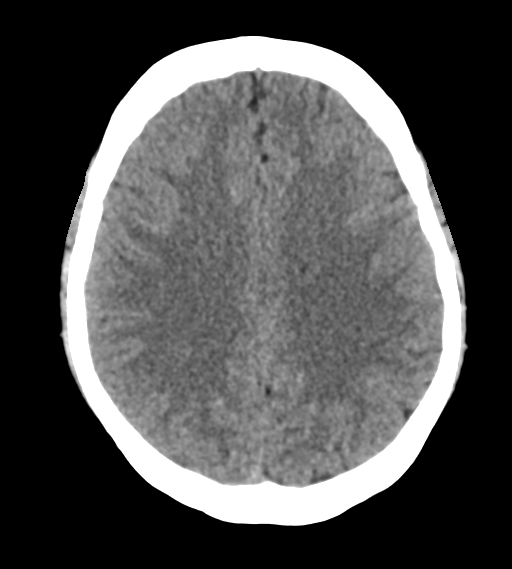
[im 21/34  bone]
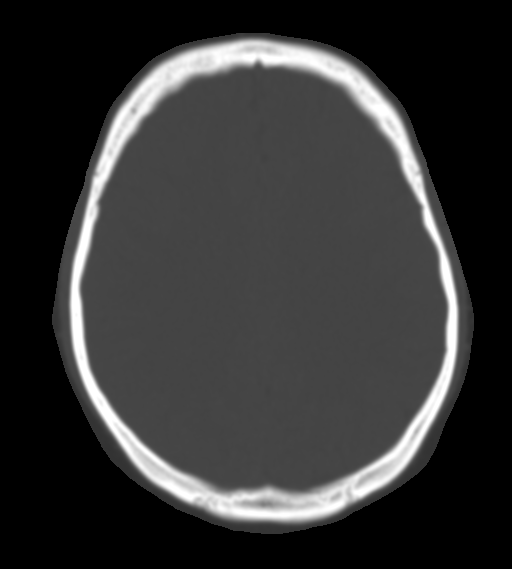
[im 25/34  brain]
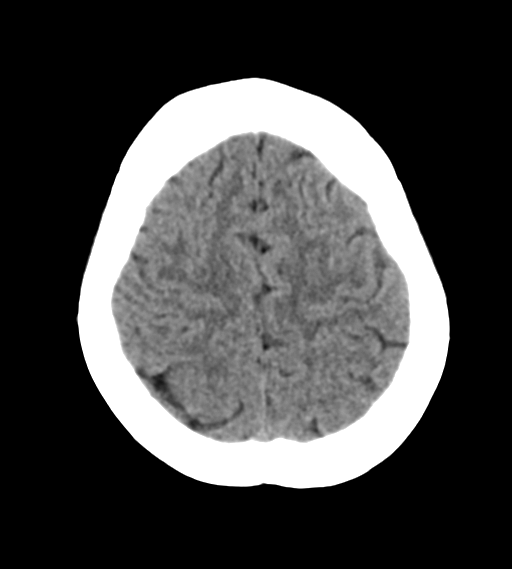
[im 29/34  brain]
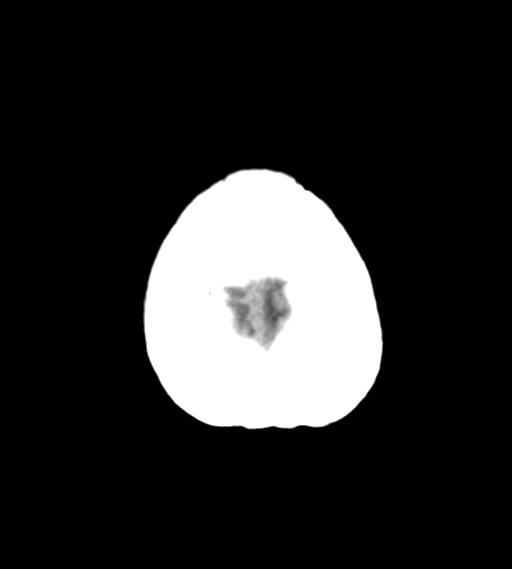

[Series 3: head bone · axial · 0.43mm/px · z∈[-92,-60]mm · 3 of 80 slices shown]
[im 8/80  bone]
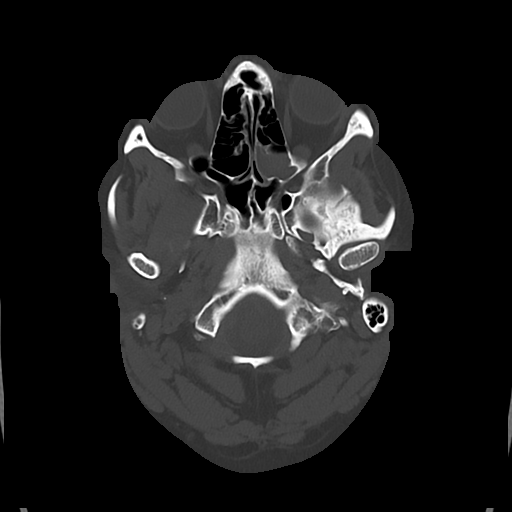
[im 16/80  bone]
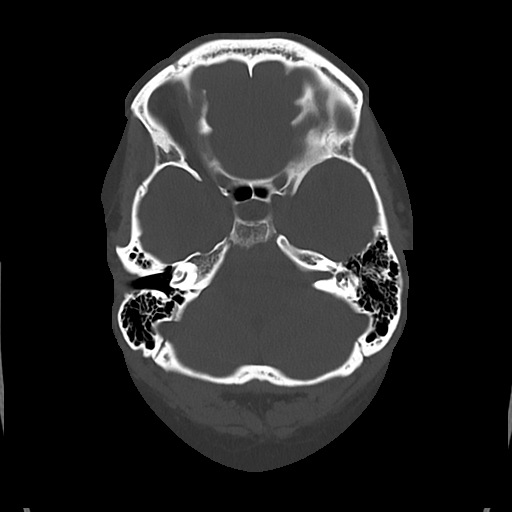
[im 24/80  bone]
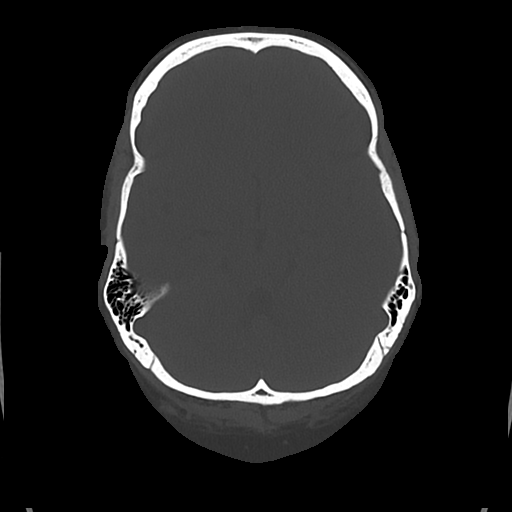

[Series 4: cor soft · coronal · 0.33mm/px · 3 of 64 slices shown]
[im 22/64  brain]
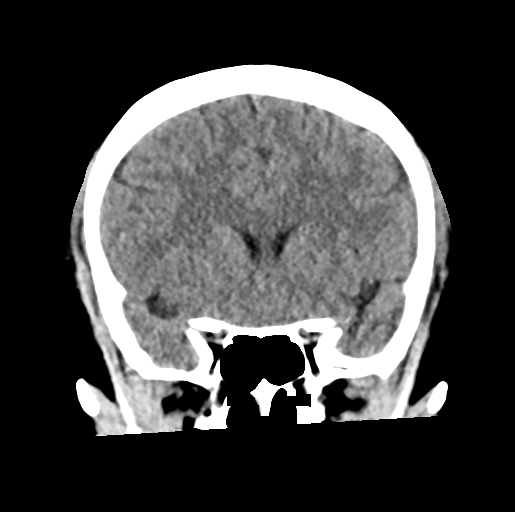
[im 29/64  brain]
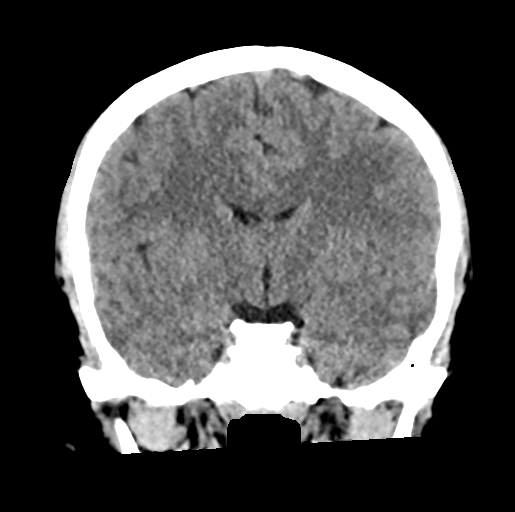
[im 36/64  brain]
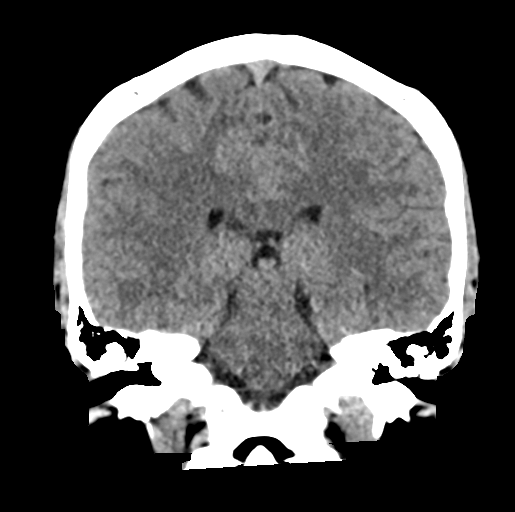

[Series 5: sag soft · sagittal · 0.33mm/px · 3 of 55 slices shown]
[im 19/55  brain]
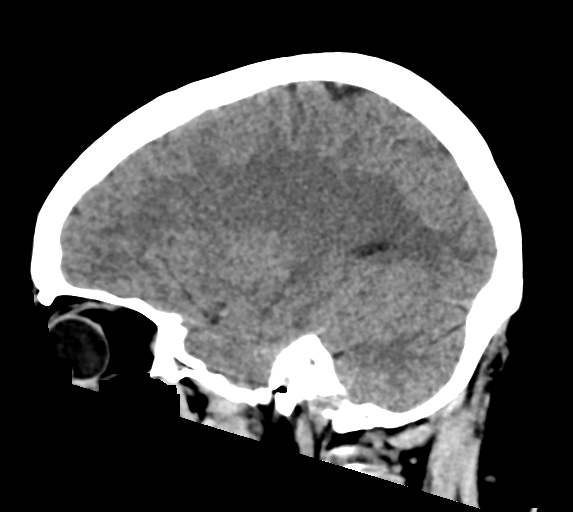
[im 28/55  brain]
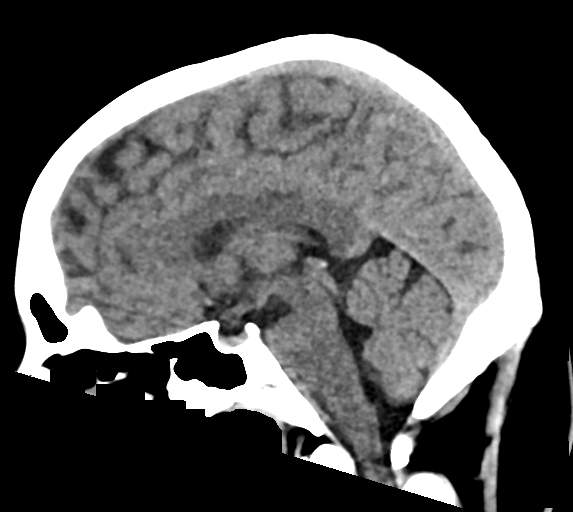
[im 37/55  brain]
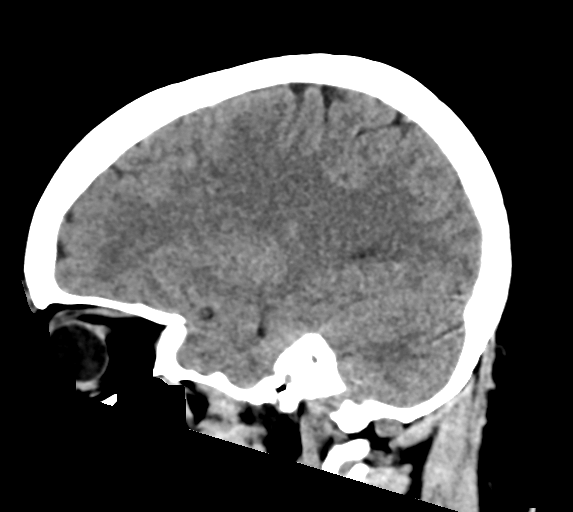

[16 of 47 positions shown; findings below may reference images not displayed]

FINDINGS: Brain: No evidence of acute infarction, hemorrhage, hydrocephalus,
extra-axial collection or mass lesion/mass effect.

Vascular: No hyperdense vessel or unexpected calcification.

Skull: Normal. Negative for fracture or focal lesion.

Sinuses/Orbits: No acute finding.

Other: None.
IMPRESSION: Normal head CT without contrast

## 2024-06-21 ENCOUNTER — Ambulatory Visit

## 2024-07-07 ENCOUNTER — Ambulatory Visit: Admitting: Family Medicine

## 2024-07-07 ENCOUNTER — Encounter: Payer: Self-pay | Admitting: Family Medicine

## 2024-07-07 VITALS — BP 106/70 | HR 56 | Ht 63.0 in | Wt 203.2 lb

## 2024-07-07 DIAGNOSIS — F53 Postpartum depression: Secondary | ICD-10-CM | POA: Diagnosis not present

## 2024-07-07 DIAGNOSIS — J309 Allergic rhinitis, unspecified: Secondary | ICD-10-CM | POA: Diagnosis not present

## 2024-07-07 MED ORDER — MONTELUKAST SODIUM 10 MG PO TABS: 10.0000 mg | ORAL_TABLET | Freq: Every day | ORAL | 3 refills | Status: AC

## 2024-07-07 MED ORDER — SERTRALINE HCL 50 MG PO TABS
50.0000 mg | ORAL_TABLET | Freq: Every day | ORAL | 3 refills | Status: AC
Start: 2024-07-07 — End: ?

## 2024-07-08 NOTE — Progress Notes (Signed)
 Established Patient Office Visit  Subjective    Patient ID: Charlotte Cohen, female    DOB: 2000/10/07  Age: 23 y.o. MRN: [id number]  CC:  Chief Complaint  Patient presents with   post partum depression     HPI [name] presents with mood swings and depression since having baby 3 months ago. She also as a [age] year old.   Outpatient Encounter Medications as of 07/07/2024  Medication Sig   albuterol  (VENTOLIN  HFA) 108 (90 Base) MCG/ACT inhaler Inhale 1-2 puffs into the lungs every 6 (six) hours as needed for wheezing or shortness of breath.   cetirizine  (ZYRTEC  ALLERGY) 10 MG tablet Take 1 tablet (10 mg total) by mouth daily.   fluticasone  (FLONASE ) 50 MCG/ACT nasal spray Place 2 sprays into both nostrils daily.   Prenatal Vit-Fe Fumarate-FA (PRENATAL PLUS VITAMIN/MINERAL) 27-1 MG TABS Take 1 tablet by mouth daily.   sertraline (ZOLOFT) 50 MG tablet Take 1 tablet (50 mg total) by mouth daily.   Blood Pressure Monitoring (BLOOD PRESSURE KIT) DEVI 1 Device by Does not apply route once a week. (Patient not taking: Reported on 02/16/2024)   ibuprofen  (ADVIL ) 600 MG tablet Take 1 tablet (600 mg total) by mouth every 6 (six) hours. (Patient taking differently: Take 600 mg by mouth every 6 (six) hours as needed for moderate pain (pain score 4-6).)   montelukast  (SINGULAIR ) 10 MG tablet Take 1 tablet (10 mg total) by mouth at bedtime.   oxyCODONE  (OXY IR/ROXICODONE ) 5 MG immediate release tablet Take 1 tablet (5 mg total) by mouth every 6 (six) hours as needed for severe pain (pain score 7-10). (Patient not taking: Reported on 07/07/2024)   [DISCONTINUED] montelukast  (SINGULAIR ) 10 MG tablet Take 1 tablet (10 mg total) by mouth at bedtime. (Patient taking differently: Take 10 mg by mouth in the morning.)   No facility-administered encounter medications on file as of 07/07/2024.    Past Medical History:  Diagnosis Date   Allergy    Asthma    Last used 02/29/24    Cholestasis (HCC)    Gall stones     Past Surgical History:  Procedure Laterality Date   BARIATRIC SURGERY N/A 2022   Gastric sleeve   CHOLECYSTECTOMY N/A 03/30/2024   Procedure: LAPAROSCOPIC CHOLECYSTECTOMY WITH INTRAOPERATIVE CHOLANGIOGRAM;  Surgeon: Belinda Cough, MD;  Location: [facility];  Service: General;  Laterality: N/A;  C-ARM   TUBAL LIGATION Bilateral 03/03/2024   Procedure: LIGATION, FALLOPIAN TUBE, POSTPARTUM;  Surgeon: Ilean Norleen GAILS, MD;  Location: [facility];  Service: Obstetrics;  Laterality: Bilateral;   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   WISDOM TOOTH EXTRACTION  2021   [age];    Family History  Problem Relation Age of Onset   Depression Mother    Anxiety disorder Mother    Healthy Mother    Bipolar disorder Mother    Bipolar disorder Father    Depression Father    Anxiety disorder Father    Healthy Father    Healthy Sister    Healthy Sister    Healthy Brother    Healthy Brother    Healthy Brother    Eczema Brother    Cancer Maternal Grandmother        vaginal/uterine   Eczema Maternal Grandfather    Eczema Paternal Grandmother    Eczema Paternal Grandfather     Social History   Socioeconomic History   Marital status: Married    Spouse name: LaMoure  Number of children: 0   Years of education: 12   Highest education level: Not on file  Occupational History   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  Tobacco Use   Smoking status: Never   Smokeless tobacco: Never  Vaping Use   Vaping status: Never Used  Substance and Sexual Activity   Alcohol use: Never   Drug use: Never   Sexual activity: Yes    Partners: Male    Birth control/protection: Surgical  Other Topics Concern   Not on file  Social History Narrative   Not on file   Social Drivers of Health   Financial Resource Strain: Low Risk  (04/03/2023)   Received from [facility]   Overall Financial Resource Strain (CARDIA)    Difficulty of Paying Living Expenses: Not hard at all  Food  Insecurity: No Food Insecurity (03/02/2024)   Hunger Vital Sign    Worried About Running Out of Food in the Last Year: Never true    Ran Out of Food in the Last Year: Never true  Transportation Needs: No Transportation Needs (03/02/2024)   PRAPARE - Administrator, Civil Service (Medical): No    Lack of Transportation (Non-Medical): No  Physical Activity: Inactive (07/07/2024)   Exercise Vital Sign    Days of Exercise per Week: 0 days    Minutes of Exercise per Session: 0 min  Stress: Stress Concern Present (07/07/2024)   [employer] of Occupational Health - Occupational Stress Questionnaire    Feeling of Stress: Very much  Social Connections: Moderately Integrated (04/03/2023)   [facility]   Social Network    How would you rate your social network (family, work, friends)?: Adequate participation with social networks  Intimate Partner Violence: Unknown (03/02/2024)   Humiliation, Afraid, Rape, and Kick questionnaire    Fear of Current or Ex-Partner: No    Emotionally Abused: No    Physically Abused: Not on file    Sexually Abused: Not on file    Review of Systems  Psychiatric/Behavioral:  Positive for depression. The patient has insomnia.   All other systems reviewed and are negative.       Objective    BP 106/70   Pulse (!) 56   Ht 5' 3 (1.6 m)   Wt 203 lb 3.2 oz (92.2 kg)   SpO2 95%   Breastfeeding No   BMI 36.00 kg/m   Physical Exam Vitals and nursing note reviewed.  Constitutional:      General: She is not in acute distress. Cardiovascular:     Rate and Rhythm: Normal rate and regular rhythm.  Pulmonary:     Effort: Pulmonary effort is normal.     Breath sounds: Normal breath sounds.  Abdominal:     Tenderness: There is no abdominal tenderness.  Neurological:     General: No focal deficit present.     Mental Status: She is alert and oriented to person, place, and time.  Psychiatric:        Mood and Affect: Mood is anxious  and depressed. Affect is tearful.        Behavior: Behavior normal.         Assessment & Plan:   Post partum depression  Allergic rhinitis, unspecified seasonality, unspecified trigger  Other orders -     Montelukast  Sodium; Take 1 tablet (10 mg total) by mouth at bedtime.  Dispense: 30 tablet; Refill: 3 -     Sertraline HCl; Take 1 tablet (50 mg  total) by mouth daily.  Dispense: 30 tablet; Refill: 3     Return in about 4 weeks (around 08/04/2024).   Tanda Raguel SQUIBB, MD

## 2024-08-04 ENCOUNTER — Ambulatory Visit: Admitting: Family Medicine
